# Patient Record
Sex: Female | Born: 1990 | Race: White | Hispanic: No | Marital: Married | Smoking: Never smoker
Health system: Southern US, Community
[De-identification: ages and names within clinical notes are randomized; demographics above are authoritative.]

## PROBLEM LIST (undated history)

## (undated) ENCOUNTER — Inpatient Hospital Stay: Payer: Self-pay

## (undated) DIAGNOSIS — O139 Gestational [pregnancy-induced] hypertension without significant proteinuria, unspecified trimester: Secondary | ICD-10-CM

## (undated) DIAGNOSIS — O99210 Obesity complicating pregnancy, unspecified trimester: Secondary | ICD-10-CM

---
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
## 2009-06-10 ENCOUNTER — Encounter: Payer: Self-pay | Admitting: Obstetrics and Gynecology

## 2009-08-19 ENCOUNTER — Encounter: Payer: Self-pay | Admitting: Maternal and Fetal Medicine

## 2009-08-26 ENCOUNTER — Encounter: Payer: Self-pay | Admitting: Obstetrics & Gynecology

## 2009-09-14 ENCOUNTER — Ambulatory Visit: Payer: Self-pay | Admitting: Obstetrics & Gynecology

## 2009-10-03 ENCOUNTER — Observation Stay: Payer: Self-pay

## 2009-10-05 ENCOUNTER — Inpatient Hospital Stay: Payer: Self-pay | Admitting: Obstetrics & Gynecology

## 2011-03-03 ENCOUNTER — Emergency Department: Payer: Self-pay | Admitting: Emergency Medicine

## 2011-10-21 ENCOUNTER — Emergency Department: Payer: Self-pay | Admitting: Unknown Physician Specialty

## 2012-05-15 ENCOUNTER — Emergency Department: Payer: Self-pay | Admitting: Emergency Medicine

## 2012-12-02 ENCOUNTER — Emergency Department: Payer: Self-pay | Admitting: Emergency Medicine

## 2013-02-07 ENCOUNTER — Emergency Department: Payer: Self-pay | Admitting: Emergency Medicine

## 2014-04-06 ENCOUNTER — Encounter: Payer: Self-pay | Admitting: Obstetrics and Gynecology

## 2014-05-21 ENCOUNTER — Encounter
Admit: 2014-05-21 | Disposition: A | Discharge status: Home / Self Care | Payer: Self-pay | Attending: Maternal & Fetal Medicine | Admitting: Maternal & Fetal Medicine

## 2014-07-16 ENCOUNTER — Ambulatory Visit
Admission: RE | Admit: 2014-07-16 | Discharge: 2014-07-16 | Disposition: A | Discharge status: Home / Self Care | Payer: Medicaid Other | Source: Ambulatory Visit | Attending: Maternal & Fetal Medicine | Admitting: Maternal & Fetal Medicine

## 2014-07-16 DIAGNOSIS — O99210 Obesity complicating pregnancy, unspecified trimester: Secondary | ICD-10-CM

## 2014-07-16 DIAGNOSIS — O359XX0 Maternal care for (suspected) fetal abnormality and damage, unspecified, not applicable or unspecified: Secondary | ICD-10-CM | POA: Insufficient documentation

## 2014-07-16 DIAGNOSIS — Z3A27 27 weeks gestation of pregnancy: Secondary | ICD-10-CM | POA: Insufficient documentation

## 2014-07-16 HISTORY — DX: Gestational (pregnancy-induced) hypertension without significant proteinuria, unspecified trimester: O13.9

## 2014-07-20 ENCOUNTER — Ambulatory Visit
Admission: RE | Admit: 2014-07-20 | Discharge: 2014-07-20 | Disposition: A | Discharge status: Home / Self Care | Payer: Medicaid Other | Source: Ambulatory Visit | Attending: Obstetrics and Gynecology | Admitting: Obstetrics and Gynecology

## 2014-07-20 DIAGNOSIS — Z3A28 28 weeks gestation of pregnancy: Secondary | ICD-10-CM | POA: Insufficient documentation

## 2014-07-20 DIAGNOSIS — O4103X0 Oligohydramnios, third trimester, not applicable or unspecified: Secondary | ICD-10-CM | POA: Insufficient documentation

## 2014-07-20 DIAGNOSIS — O4100X0 Oligohydramnios, unspecified trimester, not applicable or unspecified: Secondary | ICD-10-CM

## 2014-07-20 DIAGNOSIS — O365990 Maternal care for other known or suspected poor fetal growth, unspecified trimester, not applicable or unspecified: Secondary | ICD-10-CM | POA: Insufficient documentation

## 2014-07-20 DIAGNOSIS — O365930 Maternal care for other known or suspected poor fetal growth, third trimester, not applicable or unspecified: Secondary | ICD-10-CM | POA: Insufficient documentation

## 2014-07-20 DIAGNOSIS — IMO0002 Reserved for concepts with insufficient information to code with codable children: Secondary | ICD-10-CM

## 2014-07-20 NOTE — Progress Notes (Signed)
F/u on Thursday for dopplers (not Monday) .

## 2014-07-20 NOTE — Progress Notes (Addendum)
See ultrasound report - BPP 8/8  AFI improved at 8.6 F/u Monday for dopplers

## 2014-07-23 ENCOUNTER — Observation Stay
Admission: RE | Admit: 2014-07-23 | Discharge: 2014-07-24 | Disposition: A | Discharge status: Home / Self Care | Payer: Medicaid Other | Source: Ambulatory Visit | Attending: Obstetrics and Gynecology | Admitting: Obstetrics and Gynecology

## 2014-07-23 ENCOUNTER — Encounter: Payer: Self-pay | Admitting: *Deleted

## 2014-07-23 ENCOUNTER — Ambulatory Visit
Admission: RE | Admit: 2014-07-23 | Discharge: 2014-07-23 | Disposition: A | Discharge status: Home / Self Care | Payer: Medicaid Other | Source: Ambulatory Visit | Attending: Maternal & Fetal Medicine | Admitting: Maternal & Fetal Medicine

## 2014-07-23 VITALS — BP 153/82 | HR 75 | Temp 98.2°F | Resp 18 | Ht 64.8 in | Wt 265.0 lb

## 2014-07-23 DIAGNOSIS — Z3A28 28 weeks gestation of pregnancy: Secondary | ICD-10-CM | POA: Insufficient documentation

## 2014-07-23 DIAGNOSIS — O365990 Maternal care for other known or suspected poor fetal growth, unspecified trimester, not applicable or unspecified: Secondary | ICD-10-CM | POA: Diagnosis present

## 2014-07-23 DIAGNOSIS — O163 Unspecified maternal hypertension, third trimester: Secondary | ICD-10-CM | POA: Diagnosis present

## 2014-07-23 DIAGNOSIS — O365930 Maternal care for other known or suspected poor fetal growth, third trimester, not applicable or unspecified: Secondary | ICD-10-CM

## 2014-07-23 DIAGNOSIS — O4103X0 Oligohydramnios, third trimester, not applicable or unspecified: Secondary | ICD-10-CM | POA: Insufficient documentation

## 2014-07-23 DIAGNOSIS — R030 Elevated blood-pressure reading, without diagnosis of hypertension: Secondary | ICD-10-CM | POA: Diagnosis present

## 2014-07-23 DIAGNOSIS — O365932 Maternal care for other known or suspected poor fetal growth, third trimester, fetus 2: Secondary | ICD-10-CM

## 2014-07-23 DIAGNOSIS — O26893 Other specified pregnancy related conditions, third trimester: Secondary | ICD-10-CM | POA: Insufficient documentation

## 2014-07-23 DIAGNOSIS — Z6741 Type O blood, Rh negative: Secondary | ICD-10-CM | POA: Insufficient documentation

## 2014-07-23 DIAGNOSIS — O133 Gestational [pregnancy-induced] hypertension without significant proteinuria, third trimester: Principal | ICD-10-CM | POA: Insufficient documentation

## 2014-07-23 LAB — PROTEIN / CREATININE RATIO, URINE
CREATININE, URINE: 245 mg/dL
Protein Creatinine Ratio: 0.05 mg/mg{Cre} (ref 0.00–0.15)
TOTAL PROTEIN, URINE: 13 mg/dL

## 2014-07-23 LAB — CBC
HCT: 34.6 % — ABNORMAL LOW (ref 35.0–47.0)
Hemoglobin: 11.4 g/dL — ABNORMAL LOW (ref 12.0–16.0)
MCH: 30.4 pg (ref 26.0–34.0)
MCHC: 33 g/dL (ref 32.0–36.0)
MCV: 91.9 fL (ref 80.0–100.0)
PLATELETS: 114 10*3/uL — AB (ref 150–440)
RBC: 3.76 MIL/uL — ABNORMAL LOW (ref 3.80–5.20)
RDW: 12.8 % (ref 11.5–14.5)
WBC: 10.6 10*3/uL (ref 3.6–11.0)

## 2014-07-23 LAB — COMPREHENSIVE METABOLIC PANEL
ALBUMIN: 2.6 g/dL — AB (ref 3.5–5.0)
ALK PHOS: 92 U/L (ref 38–126)
ALT: 13 U/L — ABNORMAL LOW (ref 14–54)
AST: 19 U/L (ref 15–41)
Anion gap: 7 (ref 5–15)
BILIRUBIN TOTAL: 0.5 mg/dL (ref 0.3–1.2)
BUN: 6 mg/dL (ref 6–20)
CO2: 21 mmol/L — ABNORMAL LOW (ref 22–32)
CREATININE: 0.58 mg/dL (ref 0.44–1.00)
Calcium: 9 mg/dL (ref 8.9–10.3)
Chloride: 111 mmol/L (ref 101–111)
GFR calc Af Amer: >60 mL/min (ref >60)
GFR calc non Af Amer: >60 mL/min (ref >60)
Glucose, Bld: 74 mg/dL (ref 65–99)
Potassium: 3.8 mmol/L (ref 3.5–5.1)
SODIUM: 139 mmol/L (ref 135–145)
Total Protein: 6.2 g/dL — ABNORMAL LOW (ref 6.5–8.1)

## 2014-07-23 MED ORDER — ACETAMINOPHEN 325 MG PO TABS: 650.0000 mg | ORAL_TABLET | ORAL | Status: DC | PRN

## 2014-07-23 MED ORDER — CALCIUM CARBONATE ANTACID 500 MG PO CHEW: 2.0000 | CHEWABLE_TABLET | ORAL | Status: DC | PRN

## 2014-07-23 MED ORDER — BETAMETHASONE SOD PHOS & ACET 6 (3-3) MG/ML IJ SUSP
12.0000 mg | INTRAMUSCULAR | Status: AC
  Administered 2014-07-23 – 2014-07-24 (×2): 12 mg via INTRAMUSCULAR
  Filled 2014-07-23: qty 2

## 2014-07-23 MED ORDER — NIFEDIPINE 10 MG PO CAPS
10.0000 mg | ORAL_CAPSULE | ORAL | Status: DC | PRN
  Filled 2014-07-23: qty 2

## 2014-07-23 MED ORDER — DOCUSATE SODIUM 100 MG PO CAPS
100.0000 mg | ORAL_CAPSULE | Freq: Every day | ORAL | Status: DC
  Administered 2014-07-24: 100 mg via ORAL
  Filled 2014-07-23: qty 1

## 2014-07-23 MED ORDER — BETAMETHASONE SOD PHOS & ACET 6 (3-3) MG/ML IJ SUSP
INTRAMUSCULAR | Status: AC
  Filled 2014-07-23: qty 1

## 2014-07-23 MED ORDER — PRENATAL MULTIVITAMIN CH
1.0000 | ORAL_TABLET | Freq: Every day | ORAL | Status: DC
Start: 2014-07-24 — End: 2014-07-25
  Administered 2014-07-24: 1 via ORAL
  Filled 2014-07-23 (×3): qty 1

## 2014-07-23 MED ORDER — RHO D IMMUNE GLOBULIN 1500 UNIT/2ML IJ SOSY
300.0000 ug | PREFILLED_SYRINGE | Freq: Once | INTRAMUSCULAR | Status: DC
  Filled 2014-07-23: qty 2

## 2014-07-23 NOTE — Plan of Care (Signed)
Problem: Consults Goal: Birthing Suites Patient Information Press F2 to bring up selections list Outcome: Not Applicable Date Met:  81/84/03  PIH (Pregnancy induced hypertension)

## 2014-07-23 NOTE — Progress Notes (Signed)
Patient seen at [facility] perinatal unit (printer not functioning) and referred to labor and delivery for evaluation due to bp elevation.  Ultrasound report text:  "Single intrauterine pregnancy with an estimated gestational age of [redacted] weeks 5 day(s).  Dating assigned by LMP concordant with ultrasound performed at [facility] on 04/06/2014; measurements at that exam reported as  13 weeks 2 days. Her history is significant for previous pregnancy complicated by severe, preeterm preeclampsia (34 week delivery).   The patient returned to [facility] on 07/16/2014, Then the EFW was 760g, which was < the 3rd percentile.  there was mild oligohydramnios wiht an AFI of 4.7 with normal dopplers.  Today BPP is 8/8 and the AFI is 6.6.  A 2x2 cm pocket off fluid is visualized.    Umbilical artery dopplers are at the upper limit of normal, with an S/D ratio of 3.6 (upper limit of normal for gestational age is 3.7)  Her blood pressure is 150/83 (repeat).  She denies clinical symptoms associated with severe preeclampsia. She will be referred to labor and delivery for further evalaution/preeclampsia labs and blood pressure evaluation."  M. Merdis Snodgrass  Continue twice weekly testing. She has follow up visits scheduled at [facility] next week for bpp and dopplers, she also has NST scheduled for labor and delivery Monday July 4th.

## 2014-07-23 NOTE — H&P (Signed)
OB Admission History & Physical   History of Present Illness:  Chief Complaint: Sent over from [facility] for elevated blood pressures  HPI:  Vanessa Chen is a 24 y.o. [phone] female at [redacted]w[redacted]d dated by LMP c/w 13 week u/s.  Her pregnancy has been complicated by a history of severe preeclampsia with G1 necessitating delivery at 34 week by cesarean (breech), this followed by two additional cesarean deliveries.  This pregnancy is further complicated by fetal growth restriction (<3rd %ile), oligohydramnios, though seems to be resolved at this point.  She was being seen at [facility] clinic today at [facility] for BPP, AFI, and UA dopplers (all normal), when she was note to have elevated BPs (140's-150s/80s).  She denies headaches, visual changes, and RUQ pain.  She does have some upper and lower extremity edema.   She denies contractions.   She denies leakage of fluid.   She denies vaginal bleeding.   She notes good fetal movement.    Maternal Medical History:   Past Medical History  Diagnosis Date  . Pregnancy induced hypertension     Past Surgical History  Procedure Laterality Date  . Cesarean section     Allergies: No Known Allergies  Prior to Admission medications   Medication Sig Start Date End Date Taking? Authorizing Provider  Prenatal Vit-Fe Fumarate-FA (PRENATAL MULTIVITAMIN) TABS tablet Take 1 tablet by mouth daily at 12 noon.    Historical Provider, MD    OB History  Gravida Para Term Preterm AB SAB TAB Ectopic Multiple Living  # Outcome Date GA Lbr Len/2nd Weight Sex Delivery Anes PTL Lv  4 Current           3 Term 10/05/09     CS-Unspec     2 Term 02/06/08     CS-Unspec        Complications: Preeclampsia  1 Preterm 12/30/04    M CS-Unspec        Complications: Preeclampsia      Prenatal care site: [facility], [facility] at [facility]  Social History: She  reports that she has never smoked. She has never used smokeless tobacco. She reports that  she does not drink alcohol or use illicit drugs.  Family History: family history is not on file.   Review of Systems: Negative x 10 systems reviewed except as noted in the HPI.    Physical Exam:  Vital Signs: BP 142/83 mmHg  Pulse 70  LMP 01/03/2014 General: no acute distress.  HEENT: normocephalic, atraumatic Heart: regular rate & rhythm.  No murmurs/rubs/gallops Lungs: clear to auscultation bilaterally Abdomen: soft, gravid, non-tender;  EFW: 760 g on 07/16/14 Pelvic: deferred Extremities: non-tender, symmetric, no edema bilaterally.  DTRs: 2+  Neurologic: Alert & oriented x 3.    Pertinent Results:  Prenatal Labs: Blood type/Rh O negative  Antibody screen negative  Rubella Immune  RPR Non reactive  HBsAg negative  HIV neg  GC neg  Chlamydia neg  Genetic screening Neg   1 hour GTT 132 (early - 03/28/14)  3 hour GTT n/a  GBS unknown    Baseline FHR: 145 beats per min    Variability: moderate    Accelerations: 10x10 present    Decelerations: negative Contractions: absent Overall assessment: reassuring given gestational age    Labs:  Component     Latest Ref Rng 07/23/2014  Glucose     65 - 99 mg/dL 74  Creatinine     0.44 - 1.00 mg/dL 0.13  AST     15 - 41 U/L 19  ALT     14 - 54 U/L 13 (L)  Alkaline Phosphatase     38 - 126 U/L 92  Total Bilirubin     0.3 - 1.2 mg/dL 0.5  WBC     3.6 - 14.3 K/uL 10.6  RBC     3.80 - 5.20 MIL/uL 3.76 (L)  Hemoglobin     12.0 - 16.0 g/dL 88.8 (L)  HCT     75.7 - 47.0 % 34.6 (L)  Platelets     150 - 440 K/uL 114 (L)  Creatinine, Urine      245  Total Protein, Urine      13  Protein Creatinine Ratio     0.00 - 0.15 mg/mgCre 0.05   Ultrasound: See note From Dr. Dolphus Jenny from [facility]  Assessment:  Vanessa Chen is a 24 y.o. [phone] female at [redacted]w[redacted]d with elevated BPs in the setting of growth restriction (<3rd %ile) and oligohydramnios).  This patient presents a complicated management scenario. I have spoken with  Dr. Consuelo Pandy who has already evaluated the patient today, and updated her with her vital signs and lab results.  Based on discussion with her, I will admit the patient for further observation with plan to transfer patient for inpatient management to [facility] if she shows any indication of severe features.  To that end, will admit for observation.  See plan below.   Plan:  1. Admit for observation 2. Serial BPs 3. Administer BMTZ 4. Get type and screen, then give rhogam 5. 24 hour urine total protein 6. Will initially monitor on L&D and as frequency of BP measurements decreases, may move to floor. 7. Will transfer to [facility] for any signs of severe preeclampsia features 8. Possible discharge tomorrow if BPs stable.  Patient does have close follow up.     Conard Novak, MD, FACOG 07/23/2014 4:04 PM

## 2014-07-23 NOTE — OB Triage Note (Signed)
Pt received to LD from [facility] perinatal clinic for increased BP in clinic.

## 2014-07-24 LAB — COMPREHENSIVE METABOLIC PANEL
ALBUMIN: 2.6 g/dL — AB (ref 3.5–5.0)
ALK PHOS: 88 U/L (ref 38–126)
ALT: 15 U/L (ref 14–54)
AST: 25 U/L (ref 15–41)
Anion gap: 9 (ref 5–15)
BUN: 7 mg/dL (ref 6–20)
CALCIUM: 9 mg/dL (ref 8.9–10.3)
CO2: 19 mmol/L — AB (ref 22–32)
Chloride: 108 mmol/L (ref 101–111)
Creatinine, Ser: 0.48 mg/dL (ref 0.44–1.00)
Glucose, Bld: 119 mg/dL — ABNORMAL HIGH (ref 65–99)
Potassium: 4.1 mmol/L (ref 3.5–5.1)
SODIUM: 136 mmol/L (ref 135–145)
TOTAL PROTEIN: 6.1 g/dL — AB (ref 6.5–8.1)
Total Bilirubin: 0.7 mg/dL (ref 0.3–1.2)

## 2014-07-24 LAB — ABO/RH: ABO/RH(D): O NEG

## 2014-07-24 LAB — CBC
HCT: 36.4 % (ref 35.0–47.0)
HEMOGLOBIN: 12.3 g/dL (ref 12.0–16.0)
MCH: 30.9 pg (ref 26.0–34.0)
MCHC: 33.7 g/dL (ref 32.0–36.0)
MCV: 91.7 fL (ref 80.0–100.0)
PLATELETS: 121 10*3/uL — AB (ref 150–440)
RBC: 3.97 MIL/uL (ref 3.80–5.20)
RDW: 12.9 % (ref 11.5–14.5)
WBC: 13.6 10*3/uL — ABNORMAL HIGH (ref 3.6–11.0)

## 2014-07-24 LAB — HEMOGLOBIN A1C
Hgb A1c MFr Bld: 5.3 % (ref 4.0–6.0)
Hgb A1c MFr Bld: 5.2 % (ref 4.0–6.0)

## 2014-07-24 LAB — TYPE AND SCREEN
ABO/RH(D): O NEG
Antibody Screen: NEGATIVE

## 2014-07-24 NOTE — Discharge Instructions (Signed)
You have been diagnosed with gestational hypertension, which may evolve and become pre-eclampsia.  Please keep your prenatal appointments, as your pregnancy is high-risk.    Look for signs of worsening disease and report them to [place] immediately: 1. A headache that does not go away with Tylenol 2. Visual changes that persist 3. Right upper quadrant pain, or upper stomach pains 4. Shortness of breath at rest 5. Blood pressures that are greater than 160 (top number) or 110 (bottom number)  Continue to monitor movement of the baby, the presence of contractions, leakage of fluid, and vaginal bleeding.

## 2014-07-24 NOTE — Discharge Summary (Signed)
Antenatal Physician Discharge Summary  Patient ID: Vanessa Chen MRN: [id number] DOB/AGE: 1991/01/26 24 y.o.  Admit date: 07/23/2014 Discharge date: 07/24/2014  Admission Diagnoses: high-risk pregnancy in 3rd trimester. high blood pressures, oligohydramnios, fetal growth restriction, Rh negative maternal state.  Discharge Diagnoses: same  Prenatal Procedures: Rhogam, Betamethazone (x2), biophyisical profile, ultrasound, NST.  Consults: Neonatology, Maternal Fetal Medicine  Significant Diagnostic Studies:  Results for orders placed or performed during the hospital encounter of 07/23/14 (from the past 168 hour(s))  Protein / creatinine ratio, urine   Collection Time: 07/23/14 12:13 PM  Result Value Ref Range   Creatinine, Urine 245 mg/dL   Total Protein, Urine 13 mg/dL   Protein Creatinine Ratio 0.05 0.00 - 0.15 mg/mg[Cre]  Comprehensive metabolic panel   Collection Time: 07/23/14 12:36 PM  Result Value Ref Range   Sodium 139 135 - 145 mmol/L   Potassium 3.8 3.5 - 5.1 mmol/L   Chloride 111 101 - 111 mmol/L   CO2 21 (L) 22 - 32 mmol/L   Glucose, Bld 74 65 - 99 mg/dL   BUN 6 6 - 20 mg/dL   Creatinine, Ser 5.10 0.44 - 1.00 mg/dL   Calcium 9.0 8.9 - 25.8 mg/dL   Total Protein 6.2 (L) 6.5 - 8.1 g/dL   Albumin 2.6 (L) 3.5 - 5.0 g/dL   AST 19 15 - 41 U/L   ALT 13 (L) 14 - 54 U/L   Alkaline Phosphatase 92 38 - 126 U/L   Total Bilirubin 0.5 0.3 - 1.2 mg/dL   GFR calc non Af Amer >60 >60 mL/min   GFR calc Af Amer >60 >60 mL/min   Anion gap 7 5 - 15  CBC   Collection Time: 07/23/14 12:36 PM  Result Value Ref Range   WBC 10.6 3.6 - 11.0 K/uL   RBC 3.76 (L) 3.80 - 5.20 MIL/uL   Hemoglobin 11.4 (L) 12.0 - 16.0 g/dL   HCT 52.7 (L) 78.2 - 42.3 %   MCV 91.9 80.0 - 100.0 fL   MCH 30.4 26.0 - 34.0 pg   MCHC 33.0 32.0 - 36.0 g/dL   RDW 53.6 14.4 - 31.5 %   Platelets 114 (L) 150 - 440 K/uL  Hemoglobin A1c   Collection Time: 07/23/14  4:12 PM  Result Value Ref Range   Hgb A1c MFr  Bld 5.3 4.0 - 6.0 %  CBC   Collection Time: 07/24/14  4:43 AM  Result Value Ref Range   WBC 13.6 (H) 3.6 - 11.0 K/uL   RBC 3.97 3.80 - 5.20 MIL/uL   Hemoglobin 12.3 12.0 - 16.0 g/dL   HCT 40.0 86.7 - 61.9 %   MCV 91.7 80.0 - 100.0 fL   MCH 30.9 26.0 - 34.0 pg   MCHC 33.7 32.0 - 36.0 g/dL   RDW 50.9 32.6 - 71.2 %   Platelets 121 (L) 150 - 440 K/uL  Comprehensive metabolic panel   Collection Time: 07/24/14  4:43 AM  Result Value Ref Range   Sodium 136 135 - 145 mmol/L   Potassium 4.1 3.5 - 5.1 mmol/L   Chloride 108 101 - 111 mmol/L   CO2 19 (L) 22 - 32 mmol/L   Glucose, Bld 119 (H) 65 - 99 mg/dL   BUN 7 6 - 20 mg/dL   Creatinine, Ser 4.58 0.44 - 1.00 mg/dL   Calcium 9.0 8.9 - 09.9 mg/dL   Total Protein 6.1 (L) 6.5 - 8.1 g/dL   Albumin 2.6 (L) 3.5 - 5.0 g/dL  AST 25 15 - 41 U/L   ALT 15 14 - 54 U/L   Alkaline Phosphatase 88 38 - 126 U/L   Total Bilirubin 0.7 0.3 - 1.2 mg/dL   GFR calc non Af Amer >60 >60 mL/min   GFR calc Af Amer >60 >60 mL/min   Anion gap 9 5 - 15  Hemoglobin A1c   Collection Time: 07/24/14  4:44 AM  Result Value Ref Range   Hgb A1c MFr Bld 5.2 4.0 - 6.0 %  Type and screen   Collection Time: 07/24/14  4:44 AM  Result Value Ref Range   ABO/RH(D) O NEG    Antibody Screen NEG    Sample Expiration 07/27/2014   ABO/Rh   Collection Time: 07/24/14  4:44 AM  Result Value Ref Range   ABO/RH(D) O NEG    NST:  Baseline 150, moderate variability, 10x10 accelerations noted, however not criteria for reactivity.  Hospital Course:  This is a 24 y.o. [phone] with IUP at [redacted]w[redacted]d admitted for elevated blood pressures during third trimester of pregnancy. No leaking of fluid and no bleeding.  She received betamethasone x 2 doses. Blood pressures normalized after admission. She was seen by Neonatology during her stay.  She was observed, fetal heart rate monitoring remained reassuring, and she had no signs/symptoms of progressing preeclampsia, preterm labor, or other  maternal-fetal concerns.  She was deemed stable for discharge to home with outpatient follow up.  Discharge Exam: BP 114/71 mmHg  Pulse 71  Temp(Src) 98 F (36.7 C) (Oral)  Resp 18  SpO2 98%  LMP 01/03/2014 General appearance: alert and no distress Resp: clear to auscultation bilaterally and normal repsiratory effort Cardio: regular rate and rhythm Extremities: extremities normal, atraumatic, no cyanosis or edema   Discharge Condition: Stable  Disposition: discharge to home, with close follow up at [facility] and [facility].     Medication List    ASK your doctor about these medications        prenatal multivitamin Tabs tablet  Take 1 tablet by mouth daily at 12 noon.           Follow-up Information    Follow up with Conard Novak, MD In 3 days.   Specialty:  Obstetrics and Gynecology   Why:  for follow up and blood pressure monitoring   Contact information:   [address] [place] [address] [phone]        Has appointment with [facility] on Monday, June 27.    Signed: Leola Brazil M.D. 07/24/2014, 5:30 PM

## 2014-07-25 LAB — MISC LABCORP TEST (SEND OUT): Labcorp test code: 13664

## 2014-07-27 ENCOUNTER — Ambulatory Visit (HOSPITAL_COMMUNITY)
Admission: AD | Admit: 2014-07-27 | Discharge: 2014-07-27 | Disposition: A | Discharge status: Home / Self Care | Payer: Medicaid Other | Source: Other Acute Inpatient Hospital | Attending: Obstetrics & Gynecology | Admitting: Obstetrics & Gynecology

## 2014-07-27 ENCOUNTER — Observation Stay
Admission: EM | Admit: 2014-07-27 | Discharge: 2014-07-27 | Payer: Medicaid Other | Attending: Certified Nurse Midwife | Admitting: Certified Nurse Midwife

## 2014-07-27 ENCOUNTER — Encounter: Payer: Self-pay | Admitting: *Deleted

## 2014-07-27 ENCOUNTER — Ambulatory Visit
Admission: RE | Admit: 2014-07-27 | Discharge: 2014-07-27 | Disposition: A | Discharge status: Home / Self Care | Payer: Medicaid Other | Source: Ambulatory Visit | Attending: Maternal & Fetal Medicine | Admitting: Maternal & Fetal Medicine

## 2014-07-27 VITALS — BP 163/112 | HR 60 | Temp 98.2°F | Resp 18 | Ht 64.0 in | Wt 270.0 lb

## 2014-07-27 DIAGNOSIS — Z3A29 29 weeks gestation of pregnancy: Secondary | ICD-10-CM | POA: Insufficient documentation

## 2014-07-27 DIAGNOSIS — O169 Unspecified maternal hypertension, unspecified trimester: Secondary | ICD-10-CM | POA: Insufficient documentation

## 2014-07-27 DIAGNOSIS — Z3A00 Weeks of gestation of pregnancy not specified: Secondary | ICD-10-CM | POA: Diagnosis not present

## 2014-07-27 DIAGNOSIS — O1493 Unspecified pre-eclampsia, third trimester: Principal | ICD-10-CM | POA: Insufficient documentation

## 2014-07-27 DIAGNOSIS — O365930 Maternal care for other known or suspected poor fetal growth, third trimester, not applicable or unspecified: Secondary | ICD-10-CM

## 2014-07-27 DIAGNOSIS — R030 Elevated blood-pressure reading, without diagnosis of hypertension: Secondary | ICD-10-CM

## 2014-07-27 DIAGNOSIS — O1490 Unspecified pre-eclampsia, unspecified trimester: Secondary | ICD-10-CM | POA: Insufficient documentation

## 2014-07-27 DIAGNOSIS — O163 Unspecified maternal hypertension, third trimester: Secondary | ICD-10-CM | POA: Diagnosis present

## 2014-07-27 DIAGNOSIS — O4103X0 Oligohydramnios, third trimester, not applicable or unspecified: Secondary | ICD-10-CM | POA: Insufficient documentation

## 2014-07-27 DIAGNOSIS — Z36 Encounter for antenatal screening of mother: Secondary | ICD-10-CM | POA: Insufficient documentation

## 2014-07-27 DIAGNOSIS — IMO0002 Reserved for concepts with insufficient information to code with codable children: Secondary | ICD-10-CM

## 2014-07-27 HISTORY — DX: Obesity complicating pregnancy, unspecified trimester: O99.210

## 2014-07-27 LAB — MISC LABCORP TEST (SEND OUT): LABCORP TEST CODE: 13664

## 2014-07-27 LAB — PROTEIN, URINE, 24 HOUR
Collection Interval-UPROT: 24 hours
PROTEIN, URINE: 11 mg/dL
Protein, 24H Urine: 124 mg/d — ABNORMAL HIGH (ref 50–100)
Urine Total Volume-UPROT: 1125 mL
Urine Total Volume-UPROT: 1125 mL

## 2014-07-27 LAB — CBC
HEMATOCRIT: 34.4 % — AB (ref 35.0–47.0)
Hemoglobin: 11.4 g/dL — ABNORMAL LOW (ref 12.0–16.0)
MCH: 30.3 pg (ref 26.0–34.0)
MCHC: 33.3 g/dL (ref 32.0–36.0)
MCV: 91 fL (ref 80.0–100.0)
Platelets: 106 10*3/uL — ABNORMAL LOW (ref 150–440)
RBC: 3.78 MIL/uL — ABNORMAL LOW (ref 3.80–5.20)
RDW: 13 % (ref 11.5–14.5)
WBC: 15.4 10*3/uL — AB (ref 3.6–11.0)

## 2014-07-27 LAB — COMPREHENSIVE METABOLIC PANEL
ALT: 40 U/L (ref 14–54)
ANION GAP: 8 (ref 5–15)
AST: 45 U/L — ABNORMAL HIGH (ref 15–41)
Albumin: 2.6 g/dL — ABNORMAL LOW (ref 3.5–5.0)
Alkaline Phosphatase: 75 U/L (ref 38–126)
BUN: 11 mg/dL (ref 6–20)
CALCIUM: 8.4 mg/dL — AB (ref 8.9–10.3)
CO2: 21 mmol/L — ABNORMAL LOW (ref 22–32)
Chloride: 109 mmol/L (ref 101–111)
Creatinine, Ser: 0.51 mg/dL (ref 0.44–1.00)
GFR calc Af Amer: >60 mL/min (ref >60)
GFR calc non Af Amer: >60 mL/min (ref >60)
Glucose, Bld: 74 mg/dL (ref 65–99)
Potassium: 3.6 mmol/L (ref 3.5–5.1)
SODIUM: 138 mmol/L (ref 135–145)
TOTAL PROTEIN: 5.8 g/dL — AB (ref 6.5–8.1)
Total Bilirubin: 0.6 mg/dL (ref 0.3–1.2)

## 2014-07-27 LAB — PROTEIN / CREATININE RATIO, URINE
Creatinine, Urine: 212 mg/dL
Protein Creatinine Ratio: 0.87 mg/mg{Cre} — ABNORMAL HIGH (ref 0.00–0.15)
Total Protein, Urine: 185 mg/dL

## 2014-07-27 MED ORDER — LACTATED RINGERS IV SOLN
INTRAVENOUS | Status: DC
  Administered 2014-07-27: 16:00:00 via INTRAVENOUS

## 2014-07-27 MED ORDER — NIFEDIPINE ER 30 MG PO TB24
30.0000 mg | ORAL_TABLET | Freq: Every day | ORAL | Status: DC
  Administered 2014-07-27: 30 mg via ORAL
  Filled 2014-07-27: qty 1

## 2014-07-27 MED ORDER — LACTATED RINGERS IV SOLN: INTRAVENOUS | Status: DC

## 2014-07-27 NOTE — H&P (Signed)
History of Present Illness:  Chief Complaint: Sent over from [facility] for elevated blood pressures  HPI:  Vanessa Chen is a 24 y.o. [phone] female with EDC=10/10/2014 at 29wk2 dated by LMP c/w 13 week u/s. Her pregnancy has been complicated by a history of severe preeclampsia with G1 necessitating delivery at 34 week by cesarean (breech), this followed by two additional cesarean deliveries. This pregnancy is further complicated by fetal growth restriction (<3rd %ile), oligohydramnios, though seems to be resolved at this point. She was being seen at [facility] clinic today at [facility] for BPP, AFI, and UA dopplers (all normal), when she was note to have elevated BP of 169/112. She denies headaches, visual changes, and RUQ pain. She does have some upper and lower extremity edema. Blood pressures were 170/92, 176/81, 176/97 on arrival to L&D. She was given Nifedipine 30 mgm XL which brought the blood pressures down to normal range.  She was also seen here on 24 June and evaluated for elevated blood pressures at [facility]. Her labs were normal at that time except for platelets of 121K and a 24 hour urine was 115mm protein. She was not started on any antihypertensives at that time since her blood pressures were better on bedrest. She did receive BMZ x 2 doses. She did not get her 28 week labs and Rhogam  She denies contractions. She denies leakage of fluid. She denies vaginal bleeding. She notes good fetal movement.   Maternal Medical History:   Past Medical History  Diagnosis Date  . Pregnancy induced hypertension   Obesity with BMI>40 Past Surgical History  Procedure Laterality Date  . Cesarean sectionx3     Allergies: No Known Allergies  Prior to Admission medications   Medication Sig Start Date End Date Taking? Authorizing Provider  Prenatal Vit-Fe Fumarate-FA (PRENATAL MULTIVITAMIN) TABS tablet Take 1 tablet by mouth daily at 12 noon.     Historical Provider, MD    OB History  Gravida Para Term Preterm AB SAB TAB Ectopic Multiple Living  4 3 2 1      3     # Outcome Date GA Lbr Len/2nd Weight Sex Delivery Anes PTL Lv  4 Current           3 Term 10/05/09     LTCS     2 Term 02/06/08     LTCS      Complications: Preeclampsia  1 Preterm 12/30/04    M LTCS      Complications: Preeclampsia/HELLP/breech      [facility] Social History: She  reports that she has never smoked. She has never used smokeless tobacco. She reports that she does not drink alcohol or use illicit drugs.  Family History: family history is not on file.   Review of Systems: Negative x 10 systems reviewed except as noted in the HPI.   Physical Exam:  Vital Signs: Temp:  [98.2 F (36.8 C)] 98.2 F (36.8 C) (06/27 0813) Pulse Rate:  [54-87] 87 (06/27 1350) Resp:  [18] 18 (06/27 0813) BP: (106-176)/(66-112) 106/66 mmHg (06/27 1350) SpO2:  [96 %] 96 % (06/27 0813) Weight:  [122.471 kg (270 lb)] 122.471 kg (270 lb) (06/27 0813) General: no acute distress.  HEENT: normocephalic, atraumatic Heart: regular rate & rhythm. No murmurs Lungs: clear to auscultation bilaterally Abdomen: soft, gravid, non-tender; EFW: 760 g on 64/43/15 cephalic on today's ultrasound Pelvic: deferred Extremities: non-tender, symmetric, +1 edema bilaterally. DTRs: 3+  Neurologic: Alert & oriented x 3.   Pertinent Results:  Prenatal Labs: Blood type/Rh O negative  Antibody screen negative  Rubella Immune  RPR Non reactive  HBsAg negative  HIV neg  GC neg  Chlamydia neg  Genetic screening Neg   1 hour GTT 132 (early - 03/28/14)  3 hour GTT n/a  GBS unknown    Baseline FHR: 145 beats per min Variability: moderate Accelerations: 10x10 present  Decelerations: negative Contractions: absent Overall  assessment: reassuring given gestational age    Labs:  Recent Results (from the past 2160 hour(s))  Protein / creatinine ratio, urine     Status: None   Collection Time: 07/23/14 12:13 PM  Result Value Ref Range   Creatinine, Urine 245 mg/dL   Total Protein, Urine 13 mg/dL    Comment: NO NORMAL RANGE ESTABLISHED FOR THIS TEST   Protein Creatinine Ratio 0.05 0.00 - 0.15 mg/mg[Cre]  Comprehensive metabolic panel     Status: Abnormal   Collection Time: 07/23/14 12:36 PM  Result Value Ref Range   Sodium 139 135 - 145 mmol/L   Potassium 3.8 3.5 - 5.1 mmol/L   Chloride 111 101 - 111 mmol/L   CO2 21 (L) 22 - 32 mmol/L   Glucose, Bld 74 65 - 99 mg/dL   BUN 6 6 - 20 mg/dL   Creatinine, Ser 0.58 0.44 - 1.00 mg/dL   Calcium 9.0 8.9 - 10.3 mg/dL   Total Protein 6.2 (L) 6.5 - 8.1 g/dL   Albumin 2.6 (L) 3.5 - 5.0 g/dL   AST 19 15 - 41 U/L   ALT 13 (L) 14 - 54 U/L   Alkaline Phosphatase 92 38 - 126 U/L   Total Bilirubin 0.5 0.3 - 1.2 mg/dL   GFR calc non Af Amer >60 >60 mL/min   GFR calc Af Amer >60 >60 mL/min    Comment: (NOTE) The eGFR has been calculated using the CKD EPI equation. This calculation has not been validated in all clinical situations. eGFR's persistently <60 mL/min signify possible Chronic Kidney Disease.    Anion gap 7 5 - 15  CBC     Status: Abnormal   Collection Time: 07/23/14 12:36 PM  Result Value Ref Range   WBC 10.6 3.6 - 11.0 K/uL   RBC 3.76 (L) 3.80 - 5.20 MIL/uL   Hemoglobin 11.4 (L) 12.0 - 16.0 g/dL   HCT 34.6 (L) 35.0 - 47.0 %   MCV 91.9 80.0 - 100.0 fL   MCH 30.4 26.0 - 34.0 pg   MCHC 33.0 32.0 - 36.0 g/dL   RDW 12.8 11.5 - 14.5 %   Platelets 114 (L) 150 - 440 K/uL  Hemoglobin A1c     Status: None   Collection Time: 07/23/14  4:12 PM  Result Value Ref Range   Hgb A1c MFr Bld 5.3 4.0 - 6.0 %  CBC     Status: Abnormal   Collection Time: 07/24/14  4:43 AM  Result Value Ref Range   WBC 13.6 (H) 3.6 - 11.0 K/uL   RBC 3.97 3.80 - 5.20 MIL/uL    Hemoglobin 12.3 12.0 - 16.0 g/dL   HCT 36.4 35.0 - 47.0 %   MCV 91.7 80.0 - 100.0 fL   MCH 30.9 26.0 - 34.0 pg   MCHC 33.7 32.0 - 36.0 g/dL   RDW 12.9 11.5 - 14.5 %   Platelets 121 (L) 150 - 440 K/uL  Comprehensive metabolic panel     Status: Abnormal   Collection  Time: 07/24/14  4:43 AM  Result Value Ref Range   Sodium 136 135 - 145 mmol/L   Potassium 4.1 3.5 - 5.1 mmol/L   Chloride 108 101 - 111 mmol/L   CO2 19 (L) 22 - 32 mmol/L   Glucose, Bld 119 (H) 65 - 99 mg/dL   BUN 7 6 - 20 mg/dL   Creatinine, Ser 0.48 0.44 - 1.00 mg/dL   Calcium 9.0 8.9 - 10.3 mg/dL   Total Protein 6.1 (L) 6.5 - 8.1 g/dL   Albumin 2.6 (L) 3.5 - 5.0 g/dL   AST 25 15 - 41 U/L   ALT 15 14 - 54 U/L   Alkaline Phosphatase 88 38 - 126 U/L   Total Bilirubin 0.7 0.3 - 1.2 mg/dL   GFR calc non Af Amer >60 >60 mL/min   GFR calc Af Amer >60 >60 mL/min    Comment: (NOTE) The eGFR has been calculated using the CKD EPI equation. This calculation has not been validated in all clinical situations. eGFR's persistently <60 mL/min signify possible Chronic Kidney Disease.    Anion gap 9 5 - 15  Type and screen     Status: None   Collection Time: 07/24/14  4:44 AM  Result Value Ref Range   ABO/RH(D) O NEG    Antibody Screen NEG    Sample Expiration 07/27/2014   ABO/Rh     Status: None   Collection Time: 07/24/14  4:44 AM  Result Value Ref Range   ABO/RH(D) O NEG   Hemoglobin A1c     Status: None   Collection Time: 07/24/14  4:44 AM  Result Value Ref Range   Hgb A1c MFr Bld 5.2 4.0 - 6.0 %  Miscellaneous [facility] test (send-out)     Status: None   Collection Time: 07/24/14  4:35 PM  Result Value Ref Range   [facility] test code [id number]    [facility] test name PROTEIN TOTAL URINE    Misc [facility] result COMMENT     Comment: (NOTE) Test Ordered: [id number] Protein,Total,Urine Protein,Total,Urine            11.2             mg/dL    BN     Reference Range: Not Estab.                            Performed At: [facility] [place], [place] [id number] Lindon Romp MD [place]:[id number]   Protein, urine, 24 hour     Status: None   Collection Time: 07/24/14  6:30 PM  Result Value Ref Range   Urine Total Volume-UPROT 1125 mL   Collection Interval-UPROT URINE, RANDOM hours    Comment: CORRECTED ON 06/24 AT 1832: PREVIOUSLY REPORTED AS 24   Protein, Urine SEE [id number] FOR REPORT mg/dL   Protein, 24H Urine        50 - 100 mg/day    Comment: RESULT BELOW REPORTABLE RANGE, UNABLE TO CALCULATE.   Miscellaneous [facility] test (send-out)     Status: None   Collection Time: 07/24/14  6:30 PM  Result Value Ref Range   [facility] test code [id number]    [facility] test name protein total urine    Misc [facility] result SEE [id number] FOR REPORT.   Protein, urine, 24 hour     Status: Abnormal   Collection Time: 07/24/14  6:30 PM  Result Value Ref Range   Urine Total Volume-UPROT  1125 mL   Collection Interval-UPROT 24 hours   Protein, Urine 11 mg/dL    Comment: PERFORMED BY [facility]   Protein, 24H Urine 124 (H) 50 - 100 mg/day  Protein / creatinine ratio, urine     Status: Abnormal   Collection Time: 07/27/14 10:41 AM  Result Value Ref Range   Creatinine, Urine 212 mg/dL   Total Protein, Urine 185 mg/dL    Comment: NO NORMAL RANGE ESTABLISHED FOR THIS TEST   Protein Creatinine Ratio 0.87 (H) 0.00 - 0.15 mg/mg[Cre]  CBC     Status: Abnormal   Collection Time: 07/27/14 10:52 AM  Result Value Ref Range   WBC 15.4 (H) 3.6 - 11.0 K/uL   RBC 3.78 (L) 3.80 - 5.20 MIL/uL   Hemoglobin 11.4 (L) 12.0 - 16.0 g/dL   HCT 34.4 (L) 35.0 - 47.0 %   MCV 91.0 80.0 - 100.0 fL   MCH 30.3 26.0 - 34.0 pg   MCHC 33.3 32.0 - 36.0 g/dL   RDW 13.0 11.5 - 14.5 %   Platelets 106 (L) 150 - 440 K/uL  Comprehensive metabolic panel     Status: Abnormal   Collection Time: 07/27/14 10:52 AM  Result Value Ref Range   Sodium 138 135 - 145 mmol/L   Potassium 3.6 3.5 - 5.1 mmol/L   Chloride 109 101 - 111 mmol/L   CO2 21 (L) 22 - 32  mmol/L   Glucose, Bld 74 65 - 99 mg/dL   BUN 11 6 - 20 mg/dL   Creatinine, Ser 0.51 0.44 - 1.00 mg/dL   Calcium 8.4 (L) 8.9 - 10.3 mg/dL   Total Protein 5.8 (L) 6.5 - 8.1 g/dL   Albumin 2.6 (L) 3.5 - 5.0 g/dL   AST 45 (H) 15 - 41 U/L   ALT 40 14 - 54 U/L   Alkaline Phosphatase 75 38 - 126 U/L   Total Bilirubin 0.6 0.3 - 1.2 mg/dL   GFR calc non Af Amer >60 >60 mL/min   GFR calc Af Amer >60 >60 mL/min    Comment: (NOTE) The eGFR has been calculated using the CKD EPI equation. This calculation has not been validated in all clinical situations. eGFR's persistently <60 mL/min signify possible Chronic Kidney Disease.    Anion gap 8 5 - 15                                 Ultrasound: See note From Dr. Diamantina Monks from [facility]  Assessment:  Vanessa Chen is a 24 y.o. [phone] female at 26w2dnow with preeclampsia in the setting of growth restriction (<3rd %ile)  This patient presents a complicated management scenario. I have spoken with Dr. MManfred Shirtswho has already evaluated the patient today, and updated her with her vital signs and lab results. Based on discussion with her, [facility] T  Plan:  1.Start IV prior to transfer; per Dr GClaybon JabsI will hold magnesium sulfate for now. [facility] 3. I have explained POM to patient and partner and they are in agreement with plan.   \Dalia Heading CLandis            Routing History

## 2014-07-27 NOTE — Discharge Summary (Signed)
Pt transferred to [facility] via [facility]. Report given to [facility] RN and [facility] receiving RN, Hurshel Party. Upon d/c pt was in stable condition. Final vital signs WDL. Pt denies any other needs at this time.

## 2014-07-27 NOTE — Progress Notes (Signed)
MFM Follow up (ultrasound report and recommendations)  Single intrauterine pregnancy with an estimated gestational age of [redacted] weeks 2 day(s).  Dating assigned by LMP concordant with ultrasound performed at [facility] on 04/06/2014; measurements at that exam reported as  13 weeks 2 days. Her history is significant for previous pregnancy complicated by severe, preeterm preeclampsia (34 week delivery). She was admitted to [facility] for 2 days due to gestational hypertension.   The patient returned to [facility] on 07/16/2014, Then the EFW was 760g, which was < the 3rd percentile.  there was mild oligohydramnios wiht an AFI of 4.7 with normal dopplers.  The fetus is cephalic.  The BPP is 8/8 and the AFI is 7.6.  A 2x2 cm pocket off fluid is visualized.      Her blood pressure is 169/112 (repeat).  She was admitted to [facility] for bp monitoring and 24h urine.  Her p:c ratio was negative, serial bps normalized, and she received steriods for fetal lung maturation.  She reports no clinical sxs c/w severe preeclampsia.   She will be referred to labor and delivery for further evalaution/preeclampsia labs and blood pressure evaluation.  If her 24 hour urine is consistent with preeclampsia, she may need inpatient, tertiary care surveillance.  If the 24 hour urine is normal, she may continue with close outpatient surveillance, however, she will likely need to check her blood pressure at home, or have it checked daily as an outpatient. Delivery should occur at 34 weeks (sooner, if indicated based on maternal and neonatal testing).  If the fetal weight rises above the 5th percentile, and maternal/fetal testing is stable, delivery may be delayed until after [redacted] weeks gestation.

## 2014-07-27 NOTE — Progress Notes (Signed)
[facility] called at [phone] for transport to [facility].

## 2014-07-27 NOTE — Progress Notes (Signed)
Copies of SIX ultrasounds in reverse chronological order. She is a [employer] patient but has not had a prenatal visit with their practice. She has seen [facility] providers when in labor and delivery only.     1. 07/27/14:  Indication: oligohydraminos, IUGR, obesity.  ____________________________________________________________________________ History: Age: 24 years. ____________________________________________________________________________ Dating: LMP: 01/03/2014 EDC: 10/10/2014 GA by LMP: [redacted]w[redacted]d Best Overall Assessment: 05/21/2014 EDC: 10/10/2014 Assessed GA: [redacted]w[redacted]d The Best Overall Assessment is based on the LMP. ____________________________________________________________________________ Fetal Wellbeing Assessment: Amniotic fluid: AFI: 7.9 cm. MVP: 4.2 cm. Q1: 4.2 cm. Q2: 2.9 cm. Q3: 0.4 cm. Q4: 0.4 cm.  Non Stress Test: Fetal heart rate: 147 bpm.  Biophysical Profile: Fetal body movements: normal (2), Fetal tone: normal (2), Fetal breathing movements: normal (2), Amniotic fluid volume: abnormal (0). Score 6 / 8.   ____________________________________________________________________________ Report Summary: Impression: Single intrauterine pregnancy with an estimated gestational age of [redacted] weeks 2 day(s).  Dating assigned by LMP concordant with ultrasound performed at [facility] on 04/06/2014; measurements at that exam reported as  13 weeks 2 days. Her history is significant for previous pregnancy complicated by severe, preeterm preeclampsia (34 week delivery). She was admitted to [facility] for 2 days due to gestational hypertension.   The patient returned to [facility] on 07/16/2014, Then the EFW was 760g, which was < the 3rd percentile.  there was mild oligohydramnios wiht an AFI of 4.7 with normal dopplers.  The fetus is cephalic.  The BPP is 8/8 and the AFI is 7.6.  A 2x2 cm pocket off fluid is visualized.      Her blood pressure is 169/112 (repeat).  She was admitted to [facility] for  bp monitoring and 24h urine.  Her p:c ratio was negative, serial bps normalized, and she received steriods for fetal lung maturation.  She reports no clinical sxs c/w severe preeclampsia.   She will be referred to labor and delivery for further evalaution/preeclampsia labs and blood pressure evaluation.  If her 24 hour urine is consistent with preeclampsia, she may need inpatient, tertiary care surveillance.  If the 24 hour urine is normal, she may continue with close outpatient surveillance, however, she will likely need to check her blood pressure at home, or have it checked daily as an outpatient. Delivery should occur at 34 weeks (sooner, if indicated based on maternal and neonatal testing).  If the fetal weight rises above the 5th percentile, and maternal/fetal testing is stable, delivery may be delayed until after [redacted] weeks gestation. CODING DESCRIPTION: fetal growth restriction, gestational hypertension.  Recommendations:   Thank you for allowing [place] to participate in her care.  2, 6.23.16 Indication: Intrauterine Growth Restriction, Oligohydraminos.  ____________________________________________________________________________ History: Age: 24 years. ____________________________________________________________________________ Dating: LMP: 01/03/2014 EDC: 10/10/2014 GA by LMP: [redacted]w[redacted]d Best Overall Assessment: 05/21/2014 EDC: 10/10/2014 Assessed GA: [redacted]w[redacted]d The Best Overall Assessment is based on the LMP. ____________________________________________________________________________ Anatomy Scan: Singleton gestation.  Fetal heart activity: present. Fetal heart rate: 131 bpm.  Fetal presentation: cephalic.   ____________________________________________________________________________ Fetal Wellbeing Assessment: Amniotic fluid: oligohydramnios. AFI: 6.1 cm. MVP: 2.5 cm. Q1: 1.5 cm. Q3: 2.1 cm. Q4: 2.5 cm.  Biophysical Profile: Fetal body movements: normal (2), Fetal tone: normal (2), Fetal  breathing movements: normal (2), Amniotic fluid volume: normal (2). Score 8 / 8.   ____________________________________________________________________________ Doppler: Fetal Doppler: Umbilical Artery: PS 36.8 cm/s   ED 10.04 cm/s   S/D ratio 3.68   .  ____________________________________________________________________________ Report Summary: Impression: Single intrauterine pregnancy with an estimated gestational age of [redacted] weeks  5 day(s).  Dating assigned by LMP concordant with ultrasound performed at [facility] on 04/06/2014; measurements at that exam reported as  13 weeks 2 days. Her history is significant for previous pregnancy complicated by severe, preeterm preeclampsia (34 week delivery).   The patient returned to [facility] on 07/16/2014, Then the EFW was 760g, which was < the 3rd percentile.  there was mild oligohydramnios wiht an AFI of 4.7 with normal dopplers.  Today BPP is 8/8 and the AFI is 6.6.  A 2x2 cm pocket off fluid is visualized.    Umbilical artery dopplers are at the upper limit of normal, with an S/D ratio of 3.6 (upper limit of normal for gestational age is 3.7)  Her blood pressure is 150/83 (repeat).  She denies clinical symptoms associated with severe preeclampsia. She will be referred to labor and delivery for further evalaution/preeclampsia labs and blood pressure evaluation. CODING DESCRIPTION: fetal growth restriction, history of severe preeclampsia.  Recommendations:   Thank you for allowing [place] to participate in her care.  3.Indication: BBP/AFI, IUGR, Oligohydramnios.  ____________________________________________________________________________ History: Age: 24 years. ____________________________________________________________________________ Dating: LMP: 01/03/2014 EDC: 10/10/2014 GA by LMP: [redacted]w[redacted]d Best Overall Assessment: 05/21/2014 EDC: 10/10/2014 Assessed GA: [redacted]w[redacted]d The Best Overall Assessment is based on the  LMP. ____________________________________________________________________________ Anatomy Scan: Singleton gestation.  Fetal heart activity: present. Fetal heart rate: 134 bpm.   Fetal Anatomy: Visualized with normal appearance: gastrointestinal tract, bladder.   ____________________________________________________________________________ Fetal Wellbeing Assessment: Amniotic fluid: oligohydramnios. AFI: 8.7 cm. MVP: 3.3 cm. Q1: 3.3 cm. Q2: 2.5 cm. Q3: 1.1 cm. Q4: 1.8 cm.  Non Stress Test: Fetal heart rate: 134 bpm.  Biophysical Profile: Fetal body movements: normal (2), Fetal tone: normal (2), Fetal breathing movements: normal (2), Amniotic fluid volume: normal (2). Score 8 / 8.   ____________________________________________________________________________ Report Summary: Impression: Single intrauterine pregnancy with an estimated gestational age of [redacted] weeks 2 day(s).  Dating assigned by LMP concordant with ultrasound performed at [facility] on 04/06/2014; measurements at that exam reported as  13 weeks 2 days.  The patient returned to [facility] on 07/16/2014, Then the EFW was 760g, which was < the 3rd percentile.  there was mild oligohydramnios wiht an AFI of 4.7 with normal dopplers.  Today BPP is 8/8 and the AFI is  improved at 8.6. CODING DESCRIPTION: FGR.  Recommendations: Pt is scheduled for BPP and dopplers Thursday   Thank you for allowing [place] to participate in her care.  4. ndication: Follow Up, Obesity, Heart Views.  ____________________________________________________________________________ History: Age: 24 years. ____________________________________________________________________________ Dating: LMP: 01/03/2014 EDC: 10/10/2014 GA by LMP: [redacted]w[redacted]d Current Scan on: 07/16/2014 EDC: 10/20/2014 GA by current scan: [redacted]w[redacted]d Best Overall Assessment: 05/21/2014 EDC: 10/10/2014 Assessed GA: [redacted]w[redacted]d The calculation of the gestational age by current scan was based on BPD, HC, AC, FL  and HUM. The Best Overall Assessment is based on the LMP. ____________________________________________________________________________ Anatomy Scan: Singleton gestation. Biometry: BPD 65.4 mm  - [redacted]w[redacted]d ([redacted]w[redacted]d to [redacted]w[redacted]d) HC 240.4 mm  - [redacted]w[redacted]d ([redacted]w[redacted]d to [redacted]w[redacted]d) AC 183.5 mm  - [redacted]w[redacted]d ([redacted]w[redacted]d to [redacted]w[redacted]d) FL 50.3 mm  - [redacted]w[redacted]d ([redacted]w[redacted]d to [redacted]w[redacted]d) HUM 49.8 mm  - [redacted]w[redacted]d EFW (lbs/oz) 1 lbs 11 ozs EFW (g) 760 g <5th%   Fetal heart activity: present. Fetal heart rate: 152 bpm.  Fetal presentation: breech, posterior.  Amniotic fluid: oligohydramnios. AFI  4.7 cm. MVP 2.0 cm.  Cord: 3 vessels.  Placenta: anterior high. No previa.   Fetal Anatomy: Head: Appears normal.  Brain: visualized previously.  Face: visualized previously.  Spine: visualized previously Neck / Skin: visualized previously.  Thorax: Appears normal.  Heart: Appears normal.  Abdominal Wall: visualized previously.  Gastrointestinal Tract: Appears normal.  Kidneys / Adrenal Glands: Suboptimally visualized.  Bladder: Appears normal.  Genitalia: Visualized previously and was female.  Extremities: visualized previously.  Skeleton: Appears normal.  ____________________________________________________________________________ Doppler: Fetal Doppler: Umbilical Artery: S/D ratio 2.81   .  ____________________________________________________________________________ Report Summary: Impression: Single intrauterine pregnancy with an estimated gestational age of [redacted] weeks 5 day(s).  Dating assigned by LMP concordant with ultrasound performed at [facility] on 04/06/2014; measurements at that exam reported as  13 weeks 2 days.  The patient returned to [facility] on 05/21/2014 for detailed anatomy US.   With the exception of the left ventricular outflow tract of the fetal heart, which was suboptimally visualized due to maternal BMI > 40 and fetal position, the fetal anatomy was visualized and appeared normal.  The patient returns today for  follow-up of her detailed anatomy ultrasound.  Fetal anatomy was visualized today or visualized previously and was normal.    The EFW was 760g, which is < the 3rd percentile.  AFI was 4.7 cm.  MVP = 2.0 cm.  One 2 cm x 2 cm pocket was visualized.  Umbilical artery Doppler S/D ratio was 2.81.  The upper limit of normal is 4.55 at this gestational age. 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    She does not smoke, nor does she use drugs.    She was counseled that she should decrease her activity and rest on her side.  She was scheduled to return here to [facility] for twice weekly BPPs and AFIs with weekly Dopplers as well as a follow-up ultrasound for growth in 3 weeks.  I have recommended she transfer her care from [facility] to [facility].  I spoke to Dr. Tiburcio Pea.  Since the patient will have her testing here, at least in the short term, the patient can be seen in their [place] office and should be seen there in the next week.    Thank you for allowing [place] to participate in her care.  5. Indication: anatomy.  ____________________________________________________________________________ History: Age: 14 years. ____________________________________________________________________________ Dating: LMP: 01/03/2014 EDC: 10/10/2014 GA by LMP: [redacted]w[redacted]d Current Scan on: 05/21/2014 EDC: 10/13/2014 GA by current scan: [redacted]w[redacted]d Best Overall Assessment: 05/21/2014 EDC: 10/10/2014 Assessed GA: [redacted]w[redacted]d The calculation of the gestational age by current scan was based on BPD, HC, AC, FL and HUM. The Best Overall Assessment is based on the LMP. ____________________________________________________________________________ Anatomy Scan: Singleton gestation. Biometry: BPD 41.8 mm  - [redacted]w[redacted]d ([redacted]w[redacted]d to [redacted]w[redacted]d) HC 156.8 mm  - [redacted]w[redacted]d ([redacted]w[redacted]d to [redacted]w[redacted]d) AC 134.2 mm  - [redacted]w[redacted]d ([redacted]w[redacted]d to  [redacted]w[redacted]d) FL 32.4 mm  - [redacted]w[redacted]d ([redacted]w[redacted]d to [redacted]w[redacted]d) HUM 31.5 mm  - [redacted]w[redacted]d   Fetal heart activity: present. Fetal heart rate: 150 bpm.  Fetal presentation: variable, left.  Amniotic fluid: normal.  Cord: 3 vessels.  Placenta: anterior. No previa.   Fetal Anatomy: Head: Appears normal.  Brain: Appears normal.  Face: Appears normal.  Spine: Appears normal Neck / Skin: Appears normal.  Thorax: Appears normal.  Heart: The 4-chamber heart and RVOT was visualized normally. LVOT could not be adequately seen.  Abdominal Wall: Appears normal.  Gastrointestinal Tract: Appears normal.  Kidneys / Adrenal Glands: Appears normal.  Bladder: Appears normal.  Genitalia: Female  fetus.  Extremities: Appears normal.  Skeleton: Appears normal.  ____________________________________________________________________________ Maternal Structures: Cervical length 34 mm. ____________________________________________________________________________ Report Summary: Impression: Single intrauterine pregnancy with an estimated gestational age of [redacted] weeks 5 day(s).  Dating assigned by LMP concordant with [facility]; measurements at that exam reported as  13 weeks 2 days.  The patient returns today for detailed anatomy ultrasound.  With the exception of the left ventricular outflow tract of the fetal heart, which is suboptimally visualized due to maternal BMI > 40 and fetal position, the fetal anatomy was visualized and appears normal. CODING DESCRIPTION:Detailed anatomy ultrasound [id number]; obesity with BMI > 40.  Recommendations: The patient was scheduled to return in 8 weeks for follow-up ultrasound of the fetal heart and growth scan.    Given her BMI > 40, we would recommend monthly ultrasounds for growth starting at [redacted] weeks gestation, weekly testing at [redacted] weeks gestation and delivery at [redacted] weeks gestation.  Thank you for allowing us to participate in her care.  6. Indication:  First trimester screen.  ____________________________________________________________________________ History: Age: 1 years. ____________________________________________________________________________ Dating: LMP: 01/03/2014 EDC: 10/10/2014 GA by LMP: [id number] Current Scan on: 04/06/2014 EDC: 10/13/2014 GA by current scan: [id number] Best Overall Assessment: 05/21/2014 EDC: 10/10/2014 Assessed GA: [id number] The calculation of the gestational age by current scan was based on CRL. The Best Overall Assessment is based on the LMP. ____________________________________________________________________________ First Trimester Scan: Singleton gestation. Biometry: CRL 67.0 mm  - [id number] NT 1.60 mm Fetal Anatomy: Skull / Brain: appears normal. Heart: appears normal. Abdomen: appears normal. Stomach: visible. Hands: both visible.    Fetal heart activity: present. Fetal heart rate: 147 bpm.  Amniotic fluid: normal.  Cord: normal. Placenta: anterior.   ____________________________________________________________________________ Maternal Structures: Cervical length 42 mm. ____________________________________________________________________________ Report Summary: Impression: Intrauterine pregnancy at 4013 2/7 weeks' gestation with best EDC of 10/10/2014 (based on LMP c/w today's scan ). Limited first trimester Fetal anatomy seen appears normal.  Ovaries appear normal  The NT measurement is 1.496mm . CODING DESCRIPTION: first tri screen.  Recommendations:  serum analytes obtained , consider MSAFP only at 15-19 weeks and aantomy scan at 17-19 w Thank you for allowing us to participate in her care.

## 2014-07-27 NOTE — H&P (Cosign Needed)
  See H&P on this date

## 2014-07-30 ENCOUNTER — Inpatient Hospital Stay: Admission: RE | Admit: 2014-07-30 | Payer: Self-pay | Source: Ambulatory Visit

## 2014-08-03 ENCOUNTER — Other Ambulatory Visit: Payer: Medicaid Other

## 2014-08-06 ENCOUNTER — Inpatient Hospital Stay: Admission: RE | Admit: 2014-08-06 | Payer: Self-pay | Source: Ambulatory Visit

## 2014-09-19 ENCOUNTER — Emergency Department
Admission: EM | Admit: 2014-09-19 | Discharge: 2014-09-19 | Disposition: A | Discharge status: Home / Self Care | Payer: Medicaid Other | Attending: Emergency Medicine | Admitting: Emergency Medicine

## 2014-09-19 DIAGNOSIS — K0889 Other specified disorders of teeth and supporting structures: Secondary | ICD-10-CM

## 2014-09-19 DIAGNOSIS — Z79899 Other long term (current) drug therapy: Secondary | ICD-10-CM | POA: Diagnosis not present

## 2014-09-19 DIAGNOSIS — K0381 Cracked tooth: Secondary | ICD-10-CM | POA: Diagnosis not present

## 2014-09-19 DIAGNOSIS — R6884 Jaw pain: Secondary | ICD-10-CM | POA: Diagnosis present

## 2014-09-19 DIAGNOSIS — K088 Other specified disorders of teeth and supporting structures: Secondary | ICD-10-CM | POA: Diagnosis not present

## 2014-09-19 MED ORDER — PENICILLIN V POTASSIUM 500 MG PO TABS: 500.0000 mg | ORAL_TABLET | Freq: Four times a day (QID) | ORAL | Status: DC

## 2014-09-19 MED ORDER — NAPROXEN 500 MG PO TBEC
500.0000 mg | DELAYED_RELEASE_TABLET | Freq: Two times a day (BID) | ORAL | Status: DC
Start: 2014-09-19 — End: 2023-11-09

## 2014-09-19 NOTE — Discharge Instructions (Signed)
Dental Pain Toothache is pain in or around a tooth. It may get worse with chewing or with cold or heat.  HOME CARE  Your dentist may use a numbing medicine during treatment. If so, you may need to avoid eating until the medicine wears off. Ask your dentist about this.  Only take medicine as told by your dentist or doctor.  Avoid chewing food near the painful tooth until after all treatment is done. Ask your dentist about this. GET HELP RIGHT AWAY IF:   The problem gets worse or new problems appear.  You have a fever.  There is redness and puffiness (swelling) of the face, jaw, or neck.  You cannot open your mouth.  There is pain in the jaw.  There is very bad pain that is not helped by medicine. MAKE SURE YOU:   Understand these instructions.  Will watch your condition.  Will get help right away if you are not doing well or get worse. Document Released: 07/05/2007 Document Revised: 04/10/2011 Document Reviewed: 07/05/2007 [facility] Patient Information 2015 [place], [place]. This information is not intended to replace advice given to you by your health care provider. Make sure you discuss any questions you have with your health care provider.  Take the prescription antibiotic as directed. Follow-up with one of the dental clinics listed below if symptoms persist.  OPTIONS FOR DENTAL FOLLOW UP CARE  [place] [facility] and [facility] - Local Safety Net Dental Clinics [email or web address]   [facility] [phone])  [facility] [phone])  [place] [phone])  [facility] [phone])  [facility] [phone]) This clinic caters to the indigent population and is on a lottery system. Location: [employer] [facility], [employer], [address], [place] Clinic Hours: Wednesdays from 6pm - 9pm, patients seen by a lottery  system. For dates, call or go to [email or web address] Services: Cleanings, fillings and simple extractions. Payment Options: DENTAL WORK IS FREE OF CHARGE. Bring proof of income or support. Best way to get seen: Arrive at 5:15 pm - this is a lottery, NOT first come/first serve, so arriving earlier will not increase your chances of being seen.     [facility] [phone] Select option 1 for emergencies   Location: [facility], [place], [address] [place] Clinic Hours: No walk-ins accepted - call the day before to schedule an appointment. Check in times are 9:30 am and 1:30 pm. Services: Simple extractions, temporary fillings, pulpectomy/pulp debridement, uncomplicated abscess drainage. Payment Options: PAYMENT IS DUE AT THE TIME OF SERVICE.  Fee is usually $100-200, additional surgical procedures (e.g. abscess drainage) may be extra. Cash, checks, Visa/MasterCard accepted.  Can file Medicaid if patient is covered for dental - patient should call case worker to check. No discount for [facility] patients. Best way to get seen: MUST call the day before and get onto the schedule. Can usually be seen the next 1-2 days. No walk-ins accepted.     [facility] [phone]   Location: [facility], [address] [place] Clinic Hours: M, W, Th, F 8am or 1:30pm, Tues 9a or 1:30 - first come/first served. Services: Simple extractions, temporary fillings, uncomplicated abscess drainage.  You do not need to be an [facility] resident. Payment Options: PAYMENT IS DUE AT THE TIME OF SERVICE. Dental insurance, otherwise sliding scale - bring proof of income or support. Depending on income and  treatment needed, cost is usually $50-200. Best way to get seen: Arrive early as it is first come/first served.     [facility] [phone]    Location: [address] Clinic Hours: Mon-Thu 8a-5p Services: Most basic dental services including extractions and fillings. Payment Options: PAYMENT IS DUE AT THE TIME OF SERVICE. Sliding scale, up to 50% off - bring proof if income or support. Medicaid with dental option accepted. Best way to get seen: Call to schedule an appointment, can usually be seen within 2 weeks OR they will try to see walk-ins - show up at 8a or 2p (you may have to wait).     [facility] [phone] [place] RESIDENTS ONLY   Location: [facility], [address] [place], [place] [address] Clinic Hours: By appointment only. Monday - Thursday 8am-5pm, Friday 8am-12pm Services: Cleanings, fillings, extractions. Payment Options: PAYMENT IS DUE AT THE TIME OF SERVICE. Cash, Visa or MasterCard. Sliding scale - $30 minimum per service. Best way to get seen: Come in to office, complete packet and make an appointment - need proof of income or support monies for each household member and proof of [facility] residence. Usually takes about a month to get in.     [facility] [phone]   Location: [address] [facility] Clinic Hours: Walk-in Urgent Care Dental Services are offered Monday-Friday mornings only. The numbers of emergencies accepted daily is limited to the number of providers available. Maximum 15 - Mondays, Wednesdays & Thursdays Maximum 10 - Tuesdays & Fridays Services: You do not need to be a [facility] resident to be seen for a dental emergency. Emergencies are defined as pain, swelling, abnormal bleeding, or dental trauma. Walkins will receive x-rays if needed. NOTE: Dental cleaning is not an emergency. Payment Options: PAYMENT IS DUE AT THE TIME OF SERVICE. Minimum co-pay is $40.00 for uninsured patients. Minimum co-pay is $3.00 for Medicaid with dental coverage. Dental Insurance is  accepted and must be presented at time of visit. Medicare does not cover dental. Forms of payment: Cash, credit card, checks. Best way to get seen: If not previously registered with the clinic, walk-in dental registration begins at 7:15 am and is on a first come/first serve basis. If previously registered with the clinic, call to make an appointment.     [facility] [phone] [place] RESIDENTS ONLY   Location: [address] [place], [place] Clinic Hours: Mon-Thu 10a-2p Services: Extractions only! Payment Options: FREE (donations accepted) - bring proof of income or support Best way to get seen: Call and schedule an appointment OR come at 8am on the 1st Monday of every month (except for holidays) when it is first come/first served.     [facility] [phone]   Location: [address] [place], [place] Clinic Hours: Friday mornings Services, Payment Options, Best way to get seen: Call for info

## 2014-09-19 NOTE — ED Notes (Signed)
Pt c/o right lower jaw pain, thinks it may possible be a tooth, started today.

## 2014-09-19 NOTE — ED Notes (Signed)
Pt states right side of face (specifically jaw area is throbbing) swollen and tender to touch; pt states pain radiates upwards towards nose and inner eye area at times.

## 2014-09-21 NOTE — ED Provider Notes (Signed)
[facility] Emergency Department Provider Note ____________________________________________  Time seen: 1625  I have reviewed the triage vital signs and the nursing notes.  HISTORY  Chief Complaint  Jaw Pain  HPI Vanessa Chen is a 24 y.o. female presents with c/o right-sided lower jaw pain today. She is concerned for possible dental abscess. She denies fever, chills, or sweats. She reports some radiation into the nose and sinuses.   Past Medical History  Diagnosis Date  . Pregnancy induced hypertension   . Obesity affecting pregnancy     Patient Active Problem List   Diagnosis Date Noted  . Elevated blood-pressure reading without diagnosis of hypertension 07/23/2014  . Elevated blood pressure affecting pregnancy in third trimester, antepartum 07/23/2014  . Small for gestational age fetus affecting management of mother 07/20/2014    Past Surgical History  Procedure Laterality Date  . Cesarean section  12/30/2004  . Cesarean section  02/06/2008  . Cesarean section  10/05/2009    Current Outpatient Rx  Name  Route  Sig  Dispense  Refill  . naproxen (EC NAPROSYN) 500 MG EC tablet   Oral   Take 1 tablet (500 mg total) by mouth 2 (two) times daily with a meal.   30 tablet   0   . penicillin v potassium (VEETID) 500 MG tablet   Oral   Take 1 tablet (500 mg total) by mouth 4 (four) times daily.   40 tablet   0   . Prenatal Vit-Fe Fumarate-FA (PRENATAL MULTIVITAMIN) TABS tablet   Oral   Take 1 tablet by mouth daily at 12 noon.          Allergies Review of patient's allergies indicates no known allergies.  No family history on file.  Social History Social History  Substance Use Topics  . Smoking status: Never Smoker   . Smokeless tobacco: Never Used  . Alcohol Use: No   Review of Systems  Constitutional: Negative for fever. Eyes: Negative for visual changes. ENT: Negative for sore throat. Dental pain as above Cardiovascular:  Negative for chest pain. Respiratory: Negative for shortness of breath. Gastrointestinal: Negative for abdominal pain, vomiting and diarrhea. Genitourinary: Negative for dysuria. Musculoskeletal: Negative for back pain. Skin: Negative for rash. Neurological: Negative for headaches, focal weakness or numbness. ____________________________________________  PHYSICAL EXAM:  VITAL SIGNS: ED Triage Vitals  Enc Vitals Group     BP 09/19/14 1429 130/69 mmHg     Pulse Rate 09/19/14 1429 61     Resp 09/19/14 1429 16     Temp 09/19/14 1429 98.2 F (36.8 C)     Temp Source 09/19/14 1429 Tympanic     SpO2 09/19/14 1429 99 %     Weight 09/19/14 1429 240 lb (108.863 kg)     Height 09/19/14 1429  (1.626 m)     Head Cir --      Peak Flow --      Pain Score 09/19/14 1430 5     Pain Loc --      Pain Edu? --      Excl. in GC? --    Constitutional: Alert and oriented. Well appearing and in no distress. Eyes: Conjunctivae are normal. PERRL. Normal extraocular movements. ENT   Head: Normocephalic and atraumatic.   Nose: No congestion/rhinnorhea.   Mouth/Throat: Mucous membranes are moist. Good dentition without gum edema, fluctuance, or gingivitis. Right lower 2nd molar with small chronic fracture to the posterior aspect.    Neck: Supple. No thyromegaly.  Hematological/Lymphatic/Immunilogical: No cervical lymphadenopathy. Cardiovascular: Normal rate, regular rhythm.  Respiratory: Normal respiratory effort.  Musculoskeletal: Nontender with normal range of motion in all extremities.  Neurologic:  Normal gait without ataxia. Normal speech and language. No gross focal neurologic deficits are appreciated. Skin:  Skin is warm, dry and intact. No rash noted. Psychiatric: Mood and affect are normal. Patient exhibits appropriate insight and judgment. ____________________________________________  INITIAL IMPRESSION / ASSESSMENT AND PLAN / ED COURSE  Dental & jaw pain. Treatment with  antibiotic prophylaxis for possible dental infection. Pen VK and Napryson prescriptions provided. Dental clinic list provided.  ____________________________________________  FINAL CLINICAL IMPRESSION(S) / ED DIAGNOSES  Final diagnoses:  Pain in lower jaw  Pain, dental     Lissa Hoard, PA-C 09/21/14 1734  Loleta Rose, MD 09/21/14 [phone]

## 2016-11-07 IMAGING — US US FETAL BPP W/O NONSTRESS
1 series · 14 of 14 positions shown · non-contrast
Comparison: none

[Series 1: us fetal bpp w/o nonstress · 0.28mm/px · 14 of 14 slices shown]
[im 1/14]
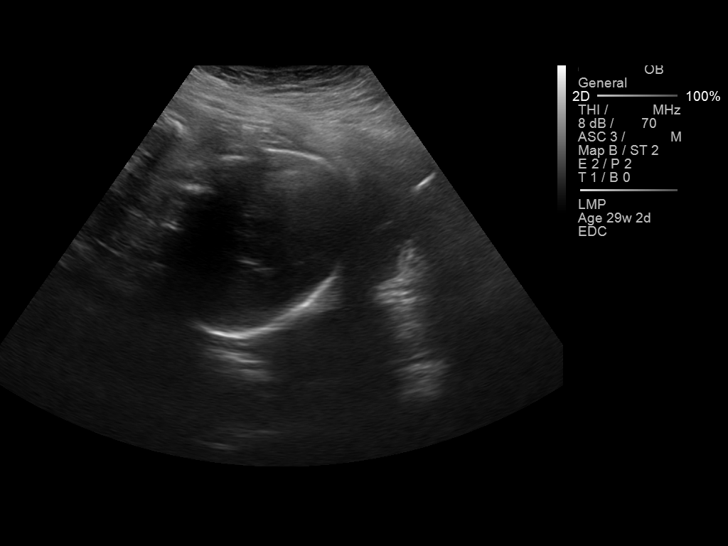
[im 2/14]
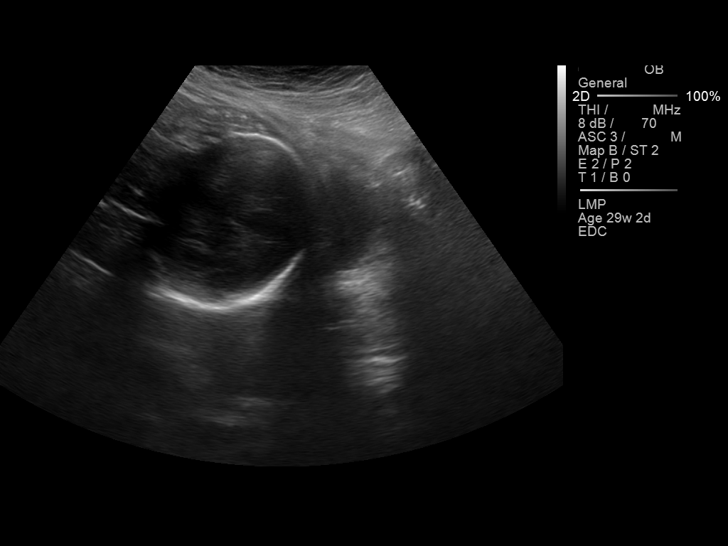
[im 3/14]
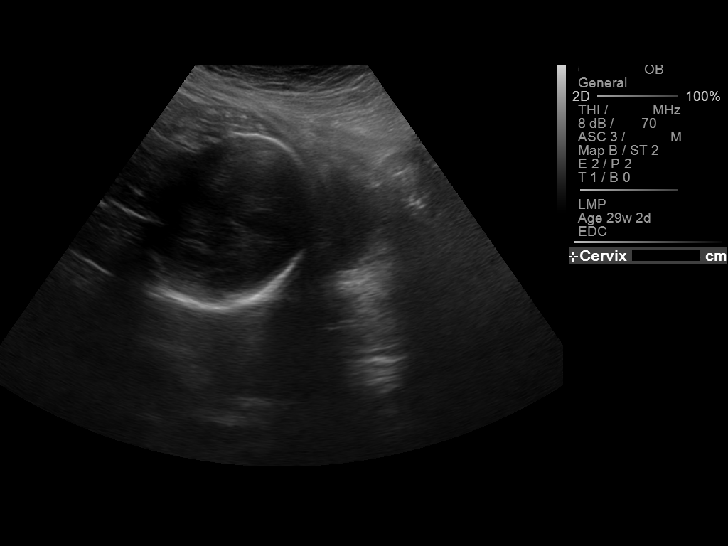
[im 4/14]
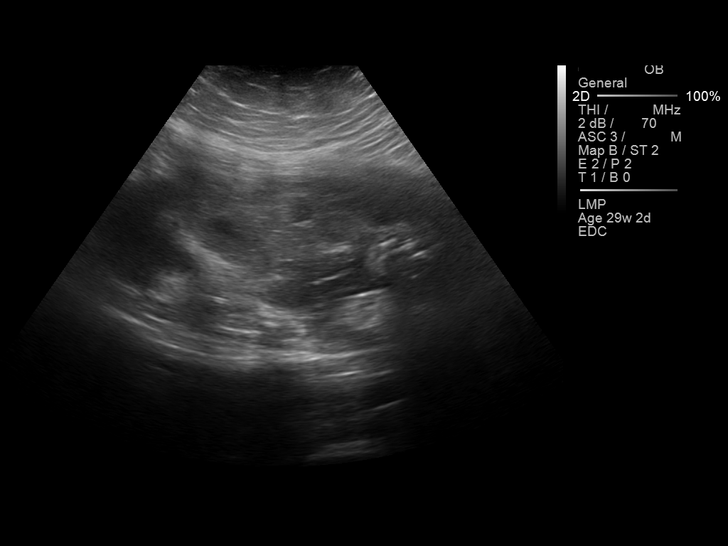
[im 5/14]
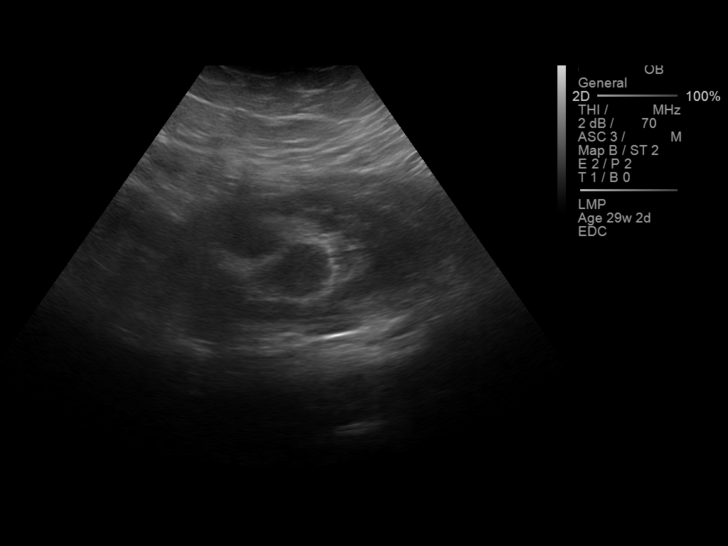
[im 6/14]
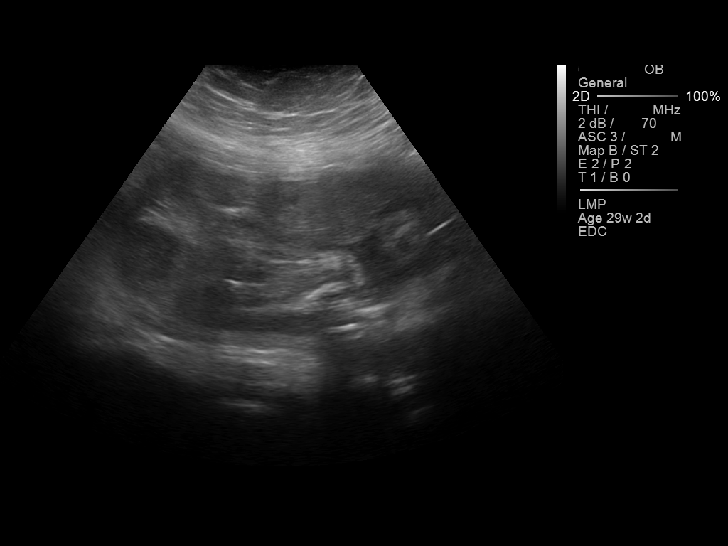
[im 7/14]
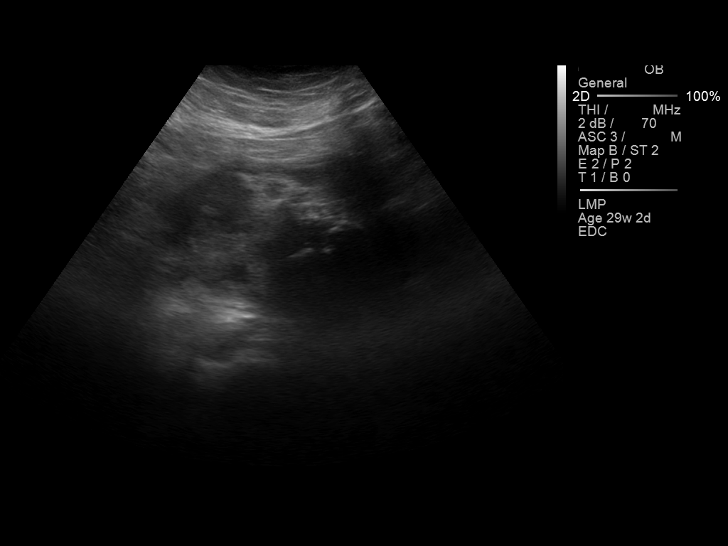
[im 8/14]
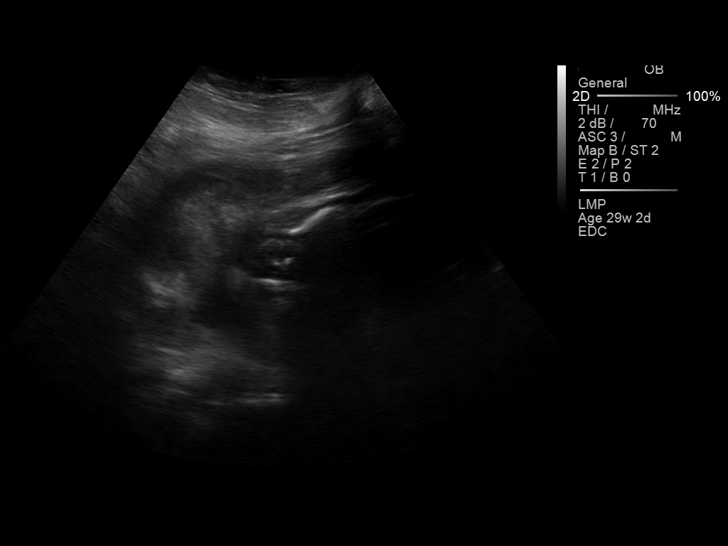
[im 9/14]
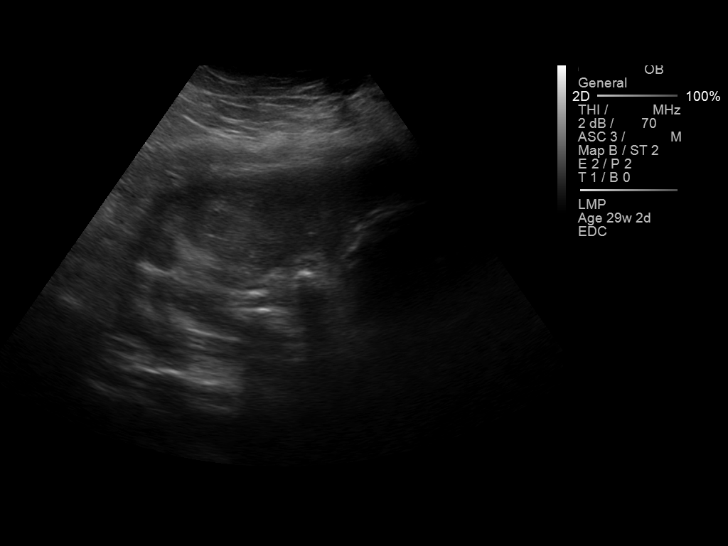
[im 10/14]
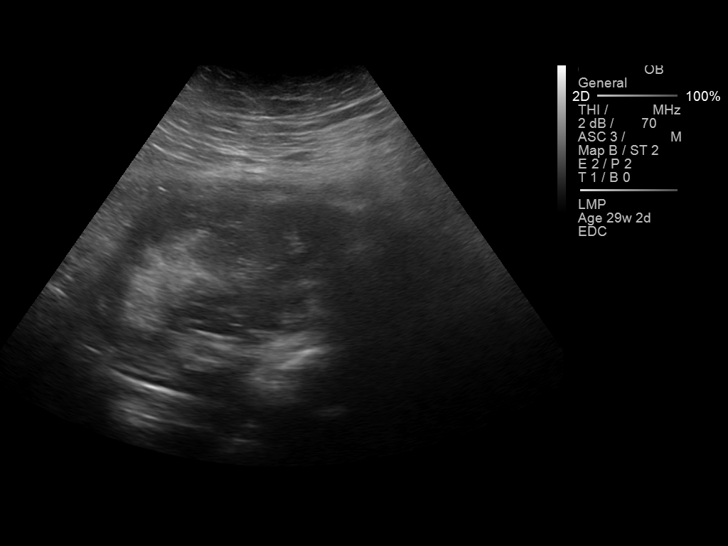
[im 11/14]
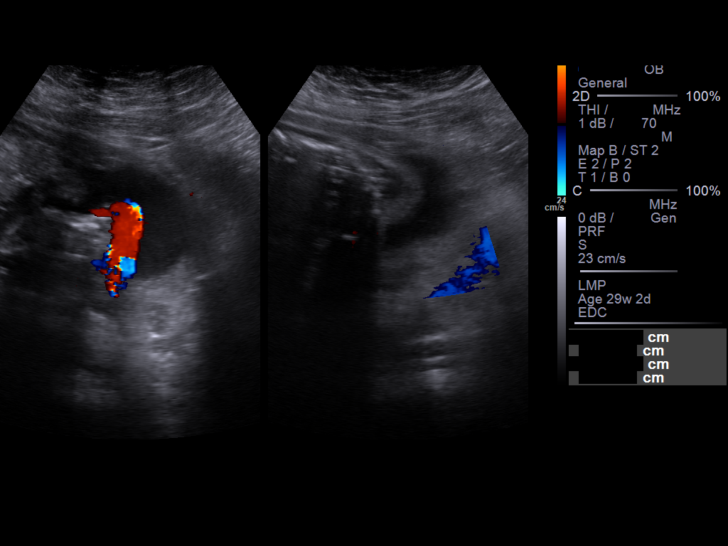
[im 12/14]
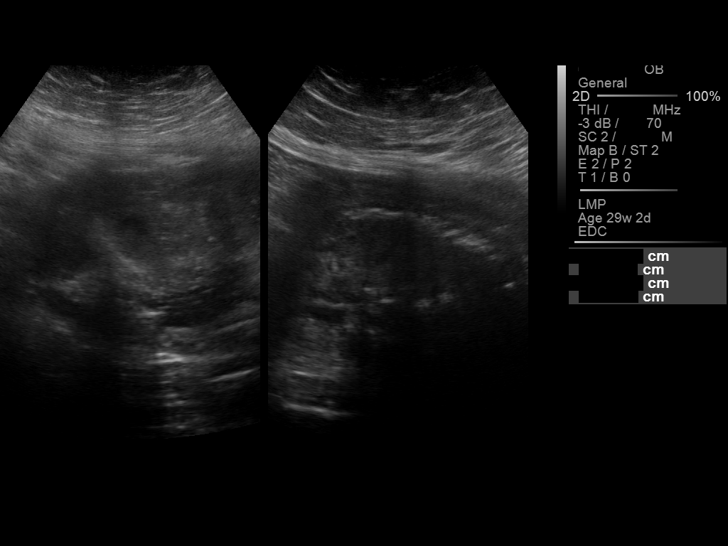
[im 13/14]
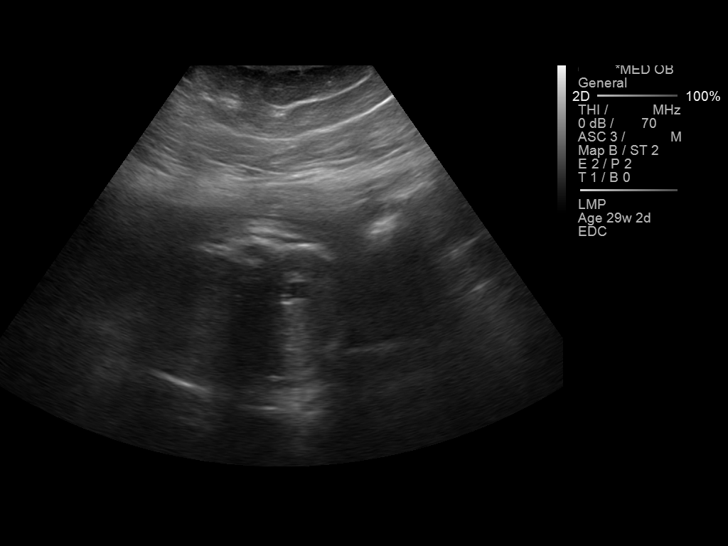
[im 14/14]
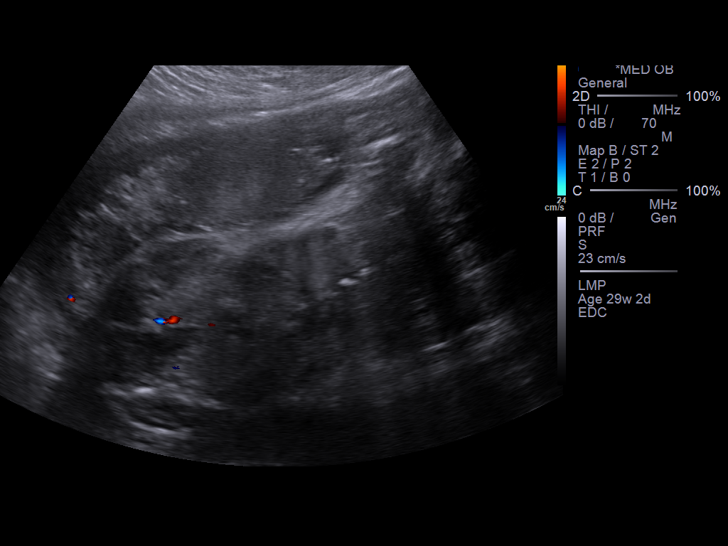

[14 of 14 positions shown; findings below may reference images not displayed]

Canned report from images found in remote index.

Refer to host system for actual result text.

## 2017-01-29 ENCOUNTER — Other Ambulatory Visit: Payer: Self-pay

## 2017-01-29 ENCOUNTER — Emergency Department
Admission: EM | Admit: 2017-01-29 | Discharge: 2017-01-29 | Disposition: A | Discharge status: Home / Self Care | Payer: Medicaid Other | Attending: Emergency Medicine | Admitting: Emergency Medicine

## 2017-01-29 ENCOUNTER — Emergency Department: Payer: Medicaid Other

## 2017-01-29 ENCOUNTER — Encounter: Payer: Self-pay | Admitting: Emergency Medicine

## 2017-01-29 DIAGNOSIS — N3001 Acute cystitis with hematuria: Secondary | ICD-10-CM | POA: Insufficient documentation

## 2017-01-29 DIAGNOSIS — Z79899 Other long term (current) drug therapy: Secondary | ICD-10-CM | POA: Diagnosis not present

## 2017-01-29 DIAGNOSIS — R350 Frequency of micturition: Secondary | ICD-10-CM | POA: Diagnosis present

## 2017-01-29 LAB — URINALYSIS, COMPLETE (UACMP) WITH MICROSCOPIC
Bilirubin Urine: NEGATIVE
GLUCOSE, UA: NEGATIVE mg/dL
KETONES UR: NEGATIVE mg/dL
Nitrite: NEGATIVE
PH: 5 (ref 5.0–8.0)
Protein, ur: 100 mg/dL — AB
SPECIFIC GRAVITY, URINE: 1.012 (ref 1.005–1.030)

## 2017-01-29 LAB — POCT PREGNANCY, URINE: Preg Test, Ur: NEGATIVE

## 2017-01-29 MED ORDER — OXYCODONE-ACETAMINOPHEN 5-325 MG PO TABS
1.0000 | ORAL_TABLET | Freq: Once | ORAL | Status: AC
Start: 2017-01-29 — End: 2017-01-29
  Administered 2017-01-29: 1 via ORAL
  Filled 2017-01-29: qty 1

## 2017-01-29 MED ORDER — ONDANSETRON HCL 4 MG PO TABS
4.0000 mg | ORAL_TABLET | Freq: Once | ORAL | Status: AC
  Administered 2017-01-29: 4 mg via ORAL
  Filled 2017-01-29: qty 1

## 2017-01-29 MED ORDER — ONDANSETRON HCL 4 MG PO TABS: 4.0000 mg | ORAL_TABLET | Freq: Every day | ORAL | 0 refills | Status: AC | PRN

## 2017-01-29 MED ORDER — PHENAZOPYRIDINE HCL 100 MG PO TABS: 100.0000 mg | ORAL_TABLET | Freq: Three times a day (TID) | ORAL | 0 refills | Status: AC | PRN

## 2017-01-29 MED ORDER — CEPHALEXIN 500 MG PO CAPS: 500.0000 mg | ORAL_CAPSULE | Freq: Two times a day (BID) | ORAL | 0 refills | Status: AC

## 2017-01-29 NOTE — ED Triage Notes (Addendum)
Pt reports severe lower back pain that radiates around to the bladder that started last night. Pt reports nausea, denies v/d. Pt reports urinary frequency and pain with urination, denies vaginal discharge.

## 2017-01-29 NOTE — ED Provider Notes (Signed)
[facility] Emergency Department Provider Note  ____________________________________________  Time seen: Approximately 4:01 PM  I have reviewed the triage vital signs and the nursing notes.   HISTORY  Chief Complaint Urinary Frequency    HPI Vanessa Chen is a 26 y.o. female that presents to the emergency department for evaluation of low back pain, dysuria, urgency of urination for 1 day.  Patient states that back pain radiates to her pelvis.  She is nauseous but has not vomited.  She has never had a kidney stone.  She denies fever, chills, vomiting, hematuria.  Past Medical History:  Diagnosis Date  . Obesity affecting pregnancy   . Pregnancy induced hypertension     Patient Active Problem List   Diagnosis Date Noted  . Elevated blood-pressure reading without diagnosis of hypertension 07/23/2014  . Elevated blood pressure affecting pregnancy in third trimester, antepartum 07/23/2014  . Small for gestational age fetus affecting management of mother 07/20/2014    Past Surgical History:  Procedure Laterality Date  . CESAREAN SECTION  12/30/2004  . CESAREAN SECTION  02/06/2008  . CESAREAN SECTION  10/05/2009    Prior to Admission medications   Medication Sig Start Date End Date Taking? Authorizing Provider  cephALEXin (KEFLEX) 500 MG capsule Take 1 capsule (500 mg total) by mouth 2 (two) times daily for 10 days. 01/29/17 02/08/17  Enid DerryWagner, Deddrick Saindon, PA-C  naproxen (EC NAPROSYN) 500 MG EC tablet Take 1 tablet (500 mg total) by mouth 2 (two) times daily with a meal. 09/19/14   Menshew, Charlesetta IvoryJenise V Bacon, PA-C  ondansetron (ZOFRAN) 4 MG tablet Take 1 tablet (4 mg total) by mouth daily as needed for nausea or vomiting. 01/29/17 01/29/18  Enid DerryWagner, Yuvaan Olander, PA-C  penicillin v potassium (VEETID) 500 MG tablet Take 1 tablet (500 mg total) by mouth 4 (four) times daily. 09/19/14   Menshew, Charlesetta IvoryJenise V Bacon, PA-C  phenazopyridine (PYRIDIUM) 100 MG tablet Take 1 tablet (100 mg  total) by mouth 3 (three) times daily as needed for up to 2 days for pain. 01/29/17 01/31/17  Enid DerryWagner, Marylu Dudenhoeffer, PA-C  Prenatal Vit-Fe Fumarate-FA (PRENATAL MULTIVITAMIN) TABS tablet Take 1 tablet by mouth daily at 12 noon.    [provider]    Allergies Patient has no known allergies.  No family history on file.  Social History Social History   Tobacco Use  . Smoking status: Never Smoker  . Smokeless tobacco: Never Used  Substance Use Topics  . Alcohol use: No  . Drug use: No     Review of Systems  Constitutional: No fever/chills Cardiovascular: No chest pain. Respiratory: No SOB. Gastrointestinal: No vomiting.  Genitourinary: Positive for dysuria. Skin: Negative for rash, abrasions, lacerations, ecchymosis.   ____________________________________________   PHYSICAL EXAM:  VITAL SIGNS: ED Triage Vitals  Enc Vitals Group     BP 01/29/17 1510 (!) 131/102     Pulse Rate 01/29/17 1510 93     Resp 01/29/17 1510 16     Temp 01/29/17 1510 98.6 F (37 C)     Temp Source 01/29/17 1510 Oral     SpO2 01/29/17 1510 100 %     Weight 01/29/17 1510 235 lb (106.6 kg)     Height 01/29/17 1510 5\' 4"  (1.626 m)     Head Circumference --      Peak Flow --      Pain Score 01/29/17 1509 6     Pain Loc --      Pain Edu? --  Excl. in GC? --      Constitutional: Alert and oriented. Well appearing and in no acute distress. Eyes: Conjunctivae are normal. PERRL. EOMI. Head: Atraumatic. ENT:      Ears:      Nose: No congestion/rhinnorhea.      Mouth/Throat: Mucous membranes are moist.  Neck: No stridor.   Cardiovascular: Normal rate, regular rhythm.  Good peripheral circulation. Respiratory: Normal respiratory effort without tachypnea or retractions. Lungs CTAB. Good air entry to the bases with no decreased or absent breath sounds. Gastrointestinal: Bowel sounds 4 quadrants. Soft and nontender to palpation. No guarding or rigidity. No palpable masses. No distention. No  CVA tenderness. Musculoskeletal: Full range of motion to all extremities. No gross deformities appreciated. Neurologic:  Normal speech and language. No gross focal neurologic deficits are appreciated.  Skin:  Skin is warm, dry and intact. No rash noted.   ____________________________________________   LABS (all labs ordered are listed, but only abnormal results are displayed)  Labs Reviewed  URINALYSIS, COMPLETE (UACMP) WITH MICROSCOPIC - Abnormal; Notable for the following components:      Result Value   Color, Urine YELLOW (*)    APPearance CLOUDY (*)    Hgb urine dipstick MODERATE (*)    Protein, ur 100 (*)    Leukocytes, UA LARGE (*)    Bacteria, UA FEW (*)    Squamous Epithelial / LPF 0-5 (*)    All other components within normal limits  POC URINE PREG, ED  POCT PREGNANCY, URINE   ____________________________________________  EKG   ____________________________________________  RADIOLOGY   Ct Renal Stone Study  Result Date: 01/29/2017 CLINICAL DATA:  Flank pain.  Evaluate for renal calculi. EXAM: CT ABDOMEN AND PELVIS WITHOUT CONTRAST TECHNIQUE: Multidetector CT imaging of the abdomen and pelvis was performed following the standard protocol without IV contrast. COMPARISON:  None FINDINGS: Lower chest: No acute abnormality. Hepatobiliary: No focal liver abnormality. Gallstones identified no gallbladder inflammation or biliary ductal dilatation. Pancreas: Unremarkable. No pancreatic ductal dilatation or surrounding inflammatory changes. Spleen: Normal in size without focal abnormality. Adrenals/Urinary Tract: The adrenal glands are normal. Unremarkable appearance of the right kidney. Left-sided pelviectasis and periureteral fat stranding. No ureteral calculi identified. No bladder stone identified. Stomach/Bowel: Stomach is within normal limits. Appendix appears normal. No evidence of bowel wall thickening, distention, or inflammatory changes. Vascular/Lymphatic: Normal  appearance of the abdominal aorta. No enlarged retroperitoneal or mesenteric adenopathy. No enlarged pelvic or inguinal lymph nodes. Reproductive: Uterus and bilateral adnexa are unremarkable. Other: No abdominal wall hernia or abnormality. No abdominopelvic ascites. Musculoskeletal: No acute or significant osseous findings. IMPRESSION: 1. There is left-sided pelviectasis and left-sided periureteral fat stranding. No stone identified within the urinary tract at this time. Findings may reflect sequelae of recently passed left ureteral calculus or perhaps urinary tract infection. Clinical correlation advised. Electronically Signed   By: Signa Kell M.D.   On: 01/29/2017 16:11    ____________________________________________    PROCEDURES  Procedure(s) performed:    Procedures    Medications  ondansetron [facility]) tablet 4 mg (4 mg Oral Given 01/29/17 1602)  oxyCODONE-acetaminophen (PERCOCET/ROXICET) 5-325 MG per tablet 1 tablet (1 tablet Oral Given 01/29/17 1602)     ____________________________________________   INITIAL IMPRESSION / ASSESSMENT AND PLAN / ED COURSE  Pertinent labs & imaging results that were available during my care of the patient were reviewed by me and considered in my medical decision making (see chart for details).  Review of the [place] CSRS was performed in accordance  of the [facility] prior to dispensing any controlled drugs.   Patient's diagnosis is consistent with urinary tract infection.  Vital signs and exam are reassuring.  Urinalysis consistent with infection.  CT indicates urinary tract infection or recent renal stone.  Pain resolved with Zofran and Percocet.  Patient will be discharged home with prescriptions for Keflex, Pyridium, Zofran. Patient is to follow up with PCP as directed. Patient is given ED precautions to return to the ED for any worsening or new symptoms.     ____________________________________________  FINAL CLINICAL IMPRESSION(S) / ED  DIAGNOSES  Final diagnoses:  Acute cystitis with hematuria      NEW MEDICATIONS STARTED DURING THIS VISIT:  ED Discharge Orders        Ordered    cephALEXin (KEFLEX) 500 MG capsule  2 times daily     01/29/17 1624    phenazopyridine (PYRIDIUM) 100 MG tablet  3 times daily PRN     01/29/17 1624    ondansetron (ZOFRAN) 4 MG tablet  Daily PRN     01/29/17 1624          This chart was dictated using voice recognition software/Dragon. Despite best efforts to proofread, errors can occur which can change the meaning. Any change was purely unintentional.    Enid DerryWagner, Ronrico Dupin, PA-C 01/29/17 1807    Merrily Brittleifenbark, Neil, MD 01/29/17 1929

## 2018-10-13 IMAGING — CT CT RENAL STONE PROTOCOL
2 of 4 series · 16 of 46 positions shown, 18 images · non-contrast
Comparison: None

CLINICAL DATA: Flank pain.  Evaluate for renal calculi.

EXAM:
CT ABDOMEN AND PELVIS WITHOUT CONTRAST
TECHNIQUE: Multidetector CT imaging of the abdomen and pelvis was performed
following the standard protocol without IV contrast.

[Series 2: stone full standard · axial · 0.84mm/px · z∈[-526,-76]mm · 13 of 100 slices shown, 15 images]
[im 5/100  soft-tissue]
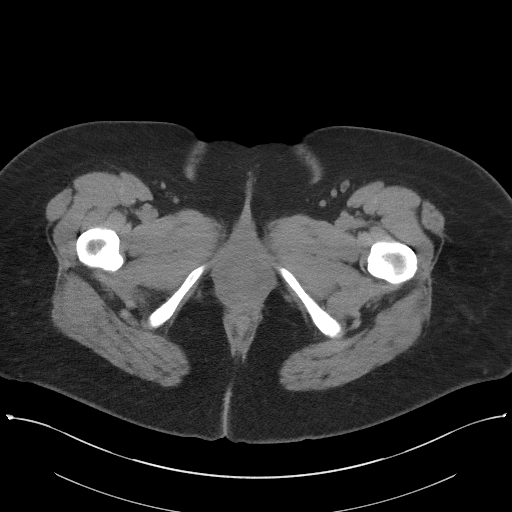
[im 5/100  bone]
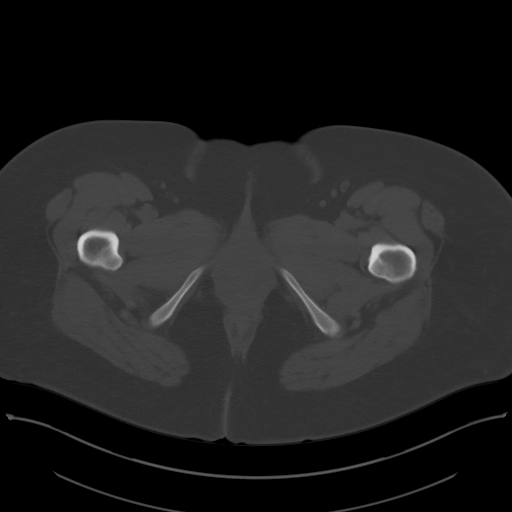
[im 13/100  soft-tissue]
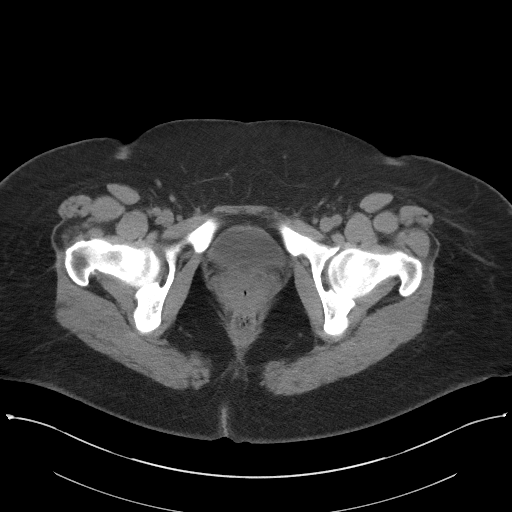
[im 21/100  soft-tissue]
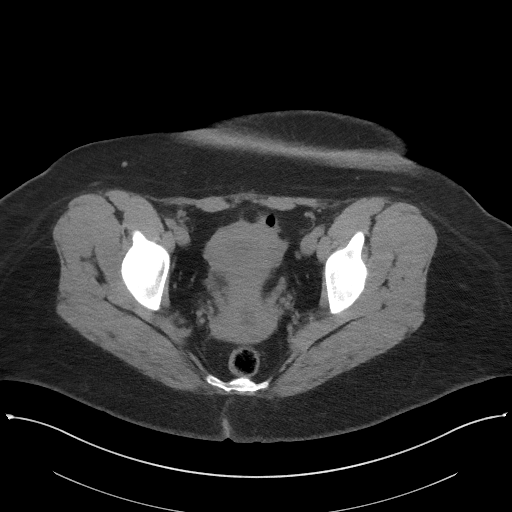
[im 29/100  soft-tissue]
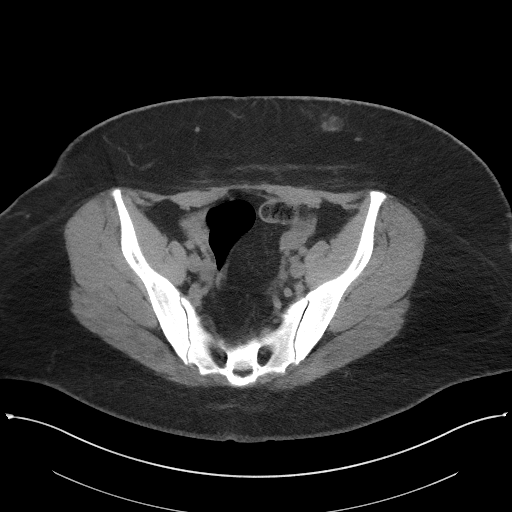
[im 34/100  soft-tissue]
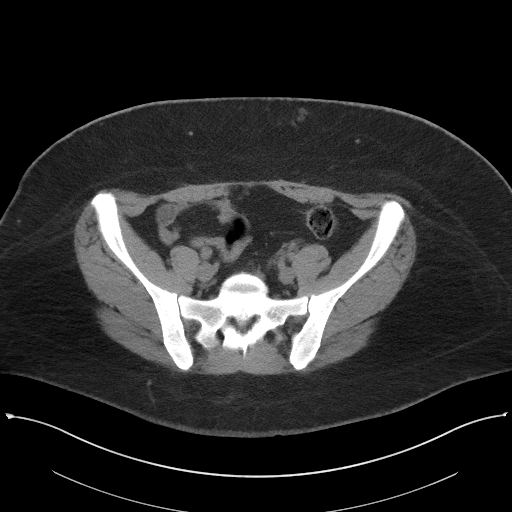
[im 42/100  soft-tissue]
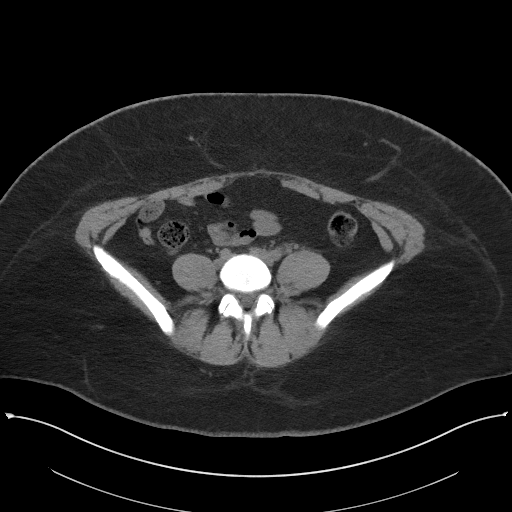
[im 50/100  soft-tissue]
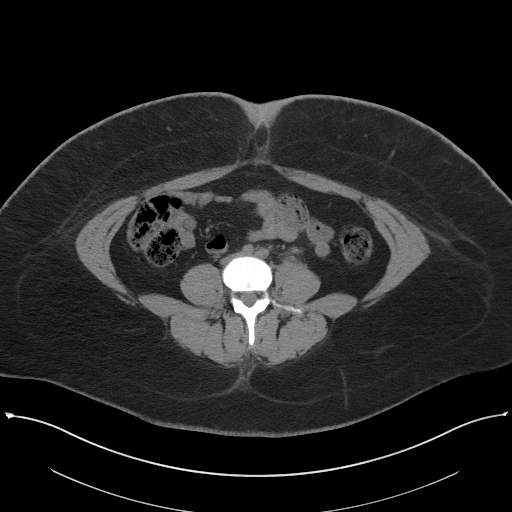
[im 58/100  soft-tissue]
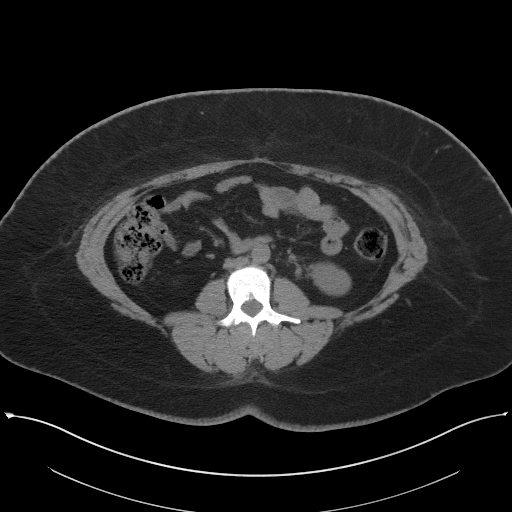
[im 67/100  soft-tissue]
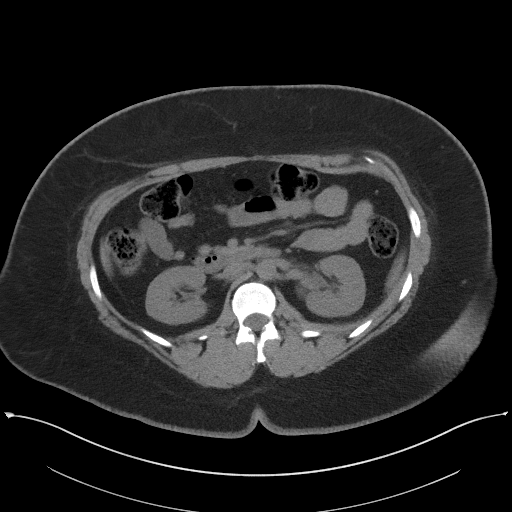
[im 67/100  bone]
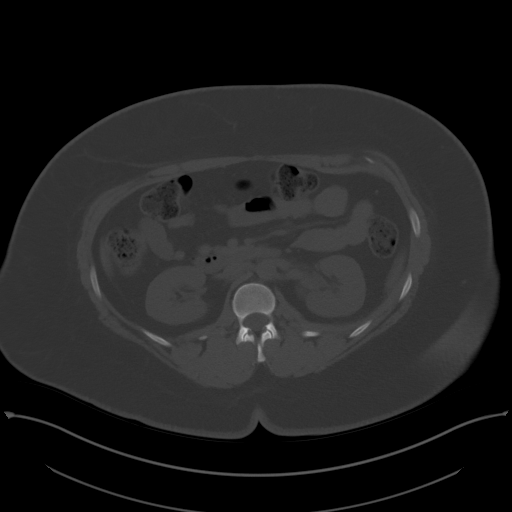
[im 71/100  soft-tissue]
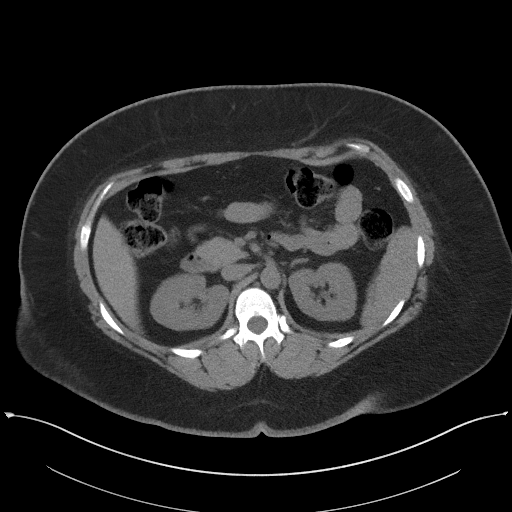
[im 79/100  soft-tissue]
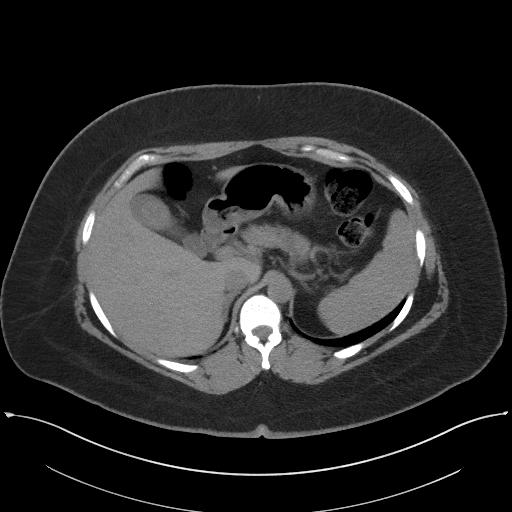
[im 87/100  soft-tissue]
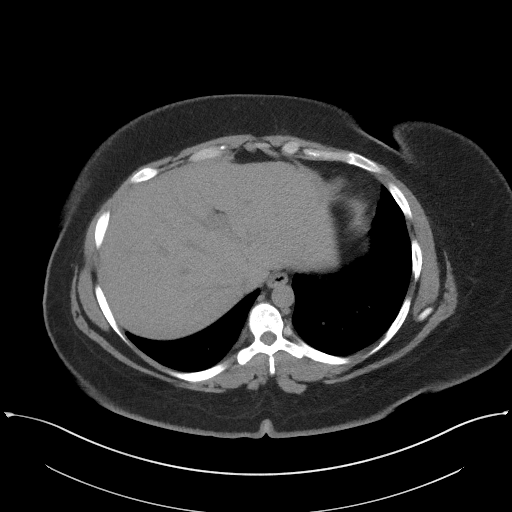
[im 95/100  soft-tissue]
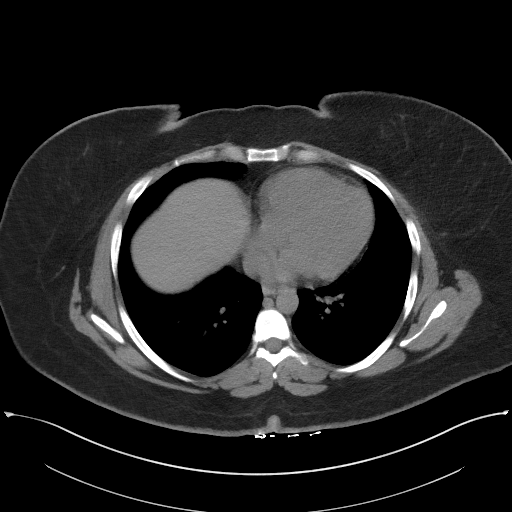

[Series 5: coronal · coronal · 0.85mm/px · 3 of 157 slices shown]
[im 53/157  soft-tissue]
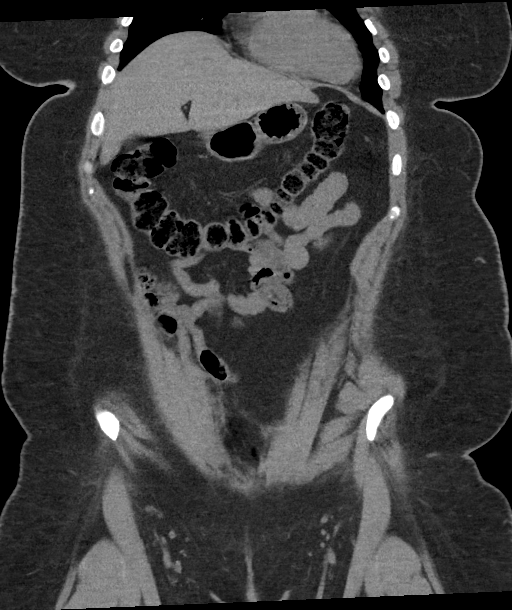
[im 70/157  soft-tissue]
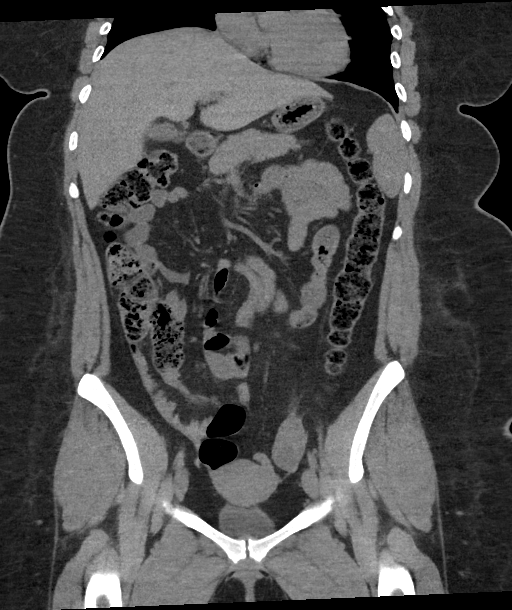
[im 87/157  soft-tissue]
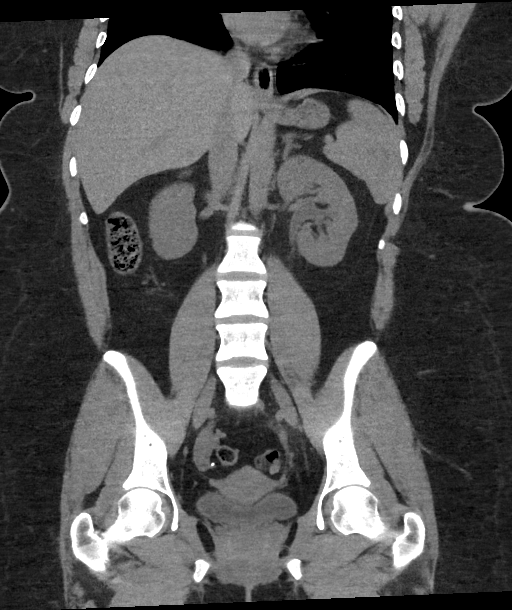

[16 of 46 positions shown; findings below may reference images not displayed]

FINDINGS: Lower chest: No acute abnormality.

Hepatobiliary: No focal liver abnormality. Gallstones identified no
gallbladder inflammation or biliary ductal dilatation.

Pancreas: Unremarkable. No pancreatic ductal dilatation or
surrounding inflammatory changes.

Spleen: Normal in size without focal abnormality.

Adrenals/Urinary Tract: The adrenal glands are normal. Unremarkable
appearance of the right kidney. Left-sided pelviectasis and
periureteral fat stranding. No ureteral calculi identified. No
bladder stone identified.

Stomach/Bowel: Stomach is within normal limits. Appendix appears
normal. No evidence of bowel wall thickening, distention, or
inflammatory changes.

Vascular/Lymphatic: Normal appearance of the abdominal aorta. No
enlarged retroperitoneal or mesenteric adenopathy. No enlarged
pelvic or inguinal lymph nodes.

Reproductive: Uterus and bilateral adnexa are unremarkable.

Other: No abdominal wall hernia or abnormality. No abdominopelvic
ascites.

Musculoskeletal: No acute or significant osseous findings.
IMPRESSION: 1. There is left-sided pelviectasis and left-sided periureteral fat
stranding. No stone identified within the urinary tract at this
time. Findings may reflect sequelae of recently passed left ureteral
calculus or perhaps urinary tract infection. Clinical correlation
advised.

## 2022-03-20 ENCOUNTER — Ambulatory Visit
Admission: EM | Admit: 2022-03-20 | Discharge: 2022-03-20 | Disposition: A | Discharge status: Home / Self Care | Payer: Medicaid Other | Attending: Emergency Medicine | Admitting: Emergency Medicine

## 2022-03-20 DIAGNOSIS — H1032 Unspecified acute conjunctivitis, left eye: Secondary | ICD-10-CM

## 2022-03-20 MED ORDER — POLYMYXIN B-TRIMETHOPRIM 10000-0.1 UNIT/ML-% OP SOLN: 1.0000 [drp] | Freq: Four times a day (QID) | OPHTHALMIC | 0 refills | Status: AC

## 2022-03-20 NOTE — ED Triage Notes (Addendum)
Patient to Urgent Care with complaints of left sided eye drainage/ redness that started this morning. Reports waking up with her eye matted shut. Denies any blurred vision.  Denies any fevers.

## 2022-03-20 NOTE — Discharge Instructions (Addendum)
Use the antibiotic eyedrops as prescribed.  Follow up with your primary care provider if your symptoms are not improving.   ? ? ?

## 2022-03-20 NOTE — ED Provider Notes (Signed)
Roderic Palau    CSN: [id number] Arrival date & time: 03/20/22  [id number]      History   Chief Complaint Chief Complaint  Patient presents with   Eye Problem    HPI Vanessa Chen is a 32 y.o. female.  Patient presents with left eye redness, drainage, crusting this morning.  The drainage this morning was yellow.  No eye trauma, vision change, eye pain, fever, chills, or other symptoms.  No treatments at home.  No pertinent medical history.    The history is provided by the patient and medical records.    Past Medical History:  Diagnosis Date   Obesity affecting pregnancy    Pregnancy induced hypertension     Patient Active Problem List   Diagnosis Date Noted   Elevated blood-pressure reading without diagnosis of hypertension 07/23/2014   Elevated blood pressure affecting pregnancy in third trimester, antepartum 07/23/2014   Small for gestational age fetus affecting management of mother 07/20/2014    Past Surgical History:  Procedure Laterality Date   CESAREAN SECTION  12/30/2004   CESAREAN SECTION  02/06/2008   CESAREAN SECTION  10/05/2009    OB History     Gravida  4   Para  3   Term  2   Preterm  1   AB  0   Living  3      SAB  0   IAB  0   Ectopic  0   Multiple  0   Live Births  3            Home Medications    Prior to Admission medications   Medication Sig Start Date End Date Taking? Authorizing Provider  trimethoprim-polymyxin b (POLYTRIM) ophthalmic solution Place 1 drop into both eyes 4 (four) times daily for 7 days. 03/20/22 03/27/22 Yes Sharion Balloon, NP  naproxen (EC NAPROSYN) 500 MG EC tablet Take 1 tablet (500 mg total) by mouth 2 (two) times daily with a meal. Patient not taking: Reported on 03/20/2022 09/19/14   Menshew, Dannielle Karvonen, PA-C  penicillin v potassium (VEETID) 500 MG tablet Take 1 tablet (500 mg total) by mouth 4 (four) times daily. Patient not taking: Reported on 03/20/2022 09/19/14   Menshew, Dannielle Karvonen,  PA-C  Prenatal Vit-Fe Fumarate-FA (PRENATAL MULTIVITAMIN) TABS tablet Take 1 tablet by mouth daily at 12 noon. Patient not taking: Reported on 03/20/2022    [provider]    Family History No family history on file.  Social History Social History   Tobacco Use   Smoking status: Never   Smokeless tobacco: Never  Substance Use Topics   Drug use: No     Allergies   Patient has no known allergies.   Review of Systems Review of Systems  Constitutional:  Negative for chills and fever.  HENT:  Negative for ear pain and sore throat.   Eyes:  Positive for discharge, redness and itching. Negative for pain and visual disturbance.  Respiratory:  Negative for cough and shortness of breath.   Gastrointestinal:  Negative for diarrhea and vomiting.  Skin:  Negative for color change and rash.  All other systems reviewed and are negative.    Physical Exam Triage Vital Signs ED Triage Vitals  Enc Vitals Group     BP 03/20/22 0941 122/80     Pulse Rate 03/20/22 0934 100     Resp 03/20/22 0934 18     Temp 03/20/22 0934 98.2 F (36.8 C)  Temp src --      SpO2 03/20/22 0934 98 %     Weight --      Height --      Head Circumference --      Peak Flow --      Pain Score 03/20/22 0933 0     Pain Loc --      Pain Edu? --      Excl. in [place]? --    No data found.  Updated Vital Signs BP 122/80   Pulse 100   Temp 98.2 F (36.8 C)   Resp 18   LMP 02/26/2022   SpO2 98%   Visual Acuity Right Eye Distance:   Left Eye Distance:   Bilateral Distance:    Right Eye Near:   Left Eye Near:    Bilateral Near:     Physical Exam Vitals and nursing note reviewed.  Constitutional:      General: She is not in acute distress.    Appearance: She is well-developed. She is not ill-appearing.  HENT:     Right Ear: Tympanic membrane normal.     Left Ear: Tympanic membrane normal.     Nose: Nose normal.     Mouth/Throat:     Mouth: Mucous membranes are moist.     Pharynx:  Oropharynx is clear.  Eyes:     General: Lids are normal. Vision grossly intact.        Right eye: No discharge.        Left eye: No discharge.     Extraocular Movements: Extraocular movements intact.     Conjunctiva/sclera:     Left eye: Left conjunctiva is injected.     Pupils: Pupils are equal, round, and reactive to light.  Cardiovascular:     Rate and Rhythm: Normal rate and regular rhythm.     Heart sounds: Normal heart sounds.  Pulmonary:     Effort: Pulmonary effort is normal. No respiratory distress.     Breath sounds: Normal breath sounds.  Musculoskeletal:     Cervical back: Neck supple.  Skin:    General: Skin is warm and dry.  Neurological:     Mental Status: She is alert.  Psychiatric:        Mood and Affect: Mood normal.        Behavior: Behavior normal.      UC Treatments / Results  Labs (all labs ordered are listed, but only abnormal results are displayed) Labs Reviewed - No data to display  EKG   Radiology No results found.  Procedures Procedures (including critical care time)  Medications Ordered in UC Medications - No data to display  Initial Impression / Assessment and Plan / UC Course  I have reviewed the triage vital signs and the nursing notes.  Pertinent labs & imaging results that were available during my care of the patient were reviewed by me and considered in my medical decision making (see chart for details).    Acute bacterial conjunctivitis of left eye.  Treating with Polytrim eyedrops.  Education provided on conjunctivitis.  Instructed patient to follow-up with her PCP if her symptoms are not improving.  ED precautions discussed.  Patient agrees to plan of care.   Final Clinical Impressions(s) / UC Diagnoses   Final diagnoses:  Acute bacterial conjunctivitis of left eye     Discharge Instructions      Use the antibiotic eyedrops as prescribed.    Follow-up with your primary care provider if  your symptoms are not  improving.          ED Prescriptions     Medication Sig Dispense Auth. Provider   trimethoprim-polymyxin b (POLYTRIM) ophthalmic solution Place 1 drop into both eyes 4 (four) times daily for 7 days. 10 mL Sharion Balloon, NP      PDMP not reviewed this encounter.   Sharion Balloon, NP 03/20/22 [phone]

## 2023-07-05 ENCOUNTER — Telehealth: Admitting: Physician Assistant

## 2023-07-05 DIAGNOSIS — R3989 Other symptoms and signs involving the genitourinary system: Secondary | ICD-10-CM | POA: Diagnosis not present

## 2023-07-05 MED ORDER — CEPHALEXIN 500 MG PO CAPS: 500.0000 mg | ORAL_CAPSULE | Freq: Two times a day (BID) | ORAL | 0 refills | Status: AC

## 2023-07-05 NOTE — Progress Notes (Signed)
 I have spent 5 minutes in review of e-visit questionnaire, review and updating patient chart, medical decision making and response to patient.   Laure Kidney, PA-C

## 2023-07-05 NOTE — Progress Notes (Signed)

## 2023-11-09 ENCOUNTER — Telehealth: Admitting: Family Medicine

## 2023-11-09 DIAGNOSIS — J069 Acute upper respiratory infection, unspecified: Secondary | ICD-10-CM | POA: Diagnosis not present

## 2023-11-09 MED ORDER — NAPROXEN 500 MG PO TABS: 500.0000 mg | ORAL_TABLET | Freq: Two times a day (BID) | ORAL | 0 refills | Status: AC

## 2023-11-09 NOTE — Progress Notes (Signed)
 E-Visit for Corona Virus Screening  Your current symptoms could be consistent with the coronavirus.  It is recommended to take an at home Covid 19 test that can be picked up an all pharmacy locations.   Many health care providers can now test patients at their office but not all are.  [facility] has multiple testing sites. For information on our COVID testing locations and hours go to [email or web address]  Please quarantine yourself while awaiting your test results.   COVID-19 is a respiratory illness with symptoms that are similar to the flu. Symptoms are typically mild to moderate, but there have been cases of severe illness and death due to the virus. The following symptoms may appear 2-14 days after exposure: Fever Cough Shortness of breath or difficulty breathing Chills Repeated shaking with chills Muscle pain Headache Sore throat New loss of taste or smell Fatigue Congestion or runny nose Nausea or vomiting Diarrhea  It is vitally important that if you feel that you have an infection such as this virus or any other virus that you stay home and away from places where you may spread it to others.  You should self-quarantine for 14 days if you have symptoms that could potentially be coronavirus or have been in close contact a with a person diagnosed with COVID-19 within the last 2 weeks. You should avoid contact with people age 79 and older.   You should wear a mask or cloth face covering over your nose and mouth if you must be around other people or animals, including pets (even at home). Try to stay at least 6 feet away from other people. This will protect the people around you.  You can use medication such as I have prescribed an anti-inflammatory - Naprosyn  500 mg. Take twice daily as needed for fever or body aches for 2 weeks  You may also take acetaminophen  (Tylenol ) as needed for fever.   Reduce your risk of any infection by using the same  precautions used for avoiding the common cold or flu:  Wash your hands often with soap and warm water for at least 20 seconds.  If soap and water are not readily available, use an alcohol-based hand sanitizer with at least 60% alcohol.  If coughing or sneezing, cover your mouth and nose by coughing or sneezing into the elbow areas of your shirt or coat, into a tissue or into your sleeve (not your hands). Avoid shaking hands with others and consider head nods or verbal greetings only. Avoid touching your eyes, nose, or mouth with unwashed hands.  Avoid close contact with people who are sick. Avoid places or events with large numbers of people in one location, like concerts or sporting events. Carefully consider travel plans you have or are making. If you are planning any travel outside or inside the [place] , visit the CDC's Travelers' Health webpage for the latest health notices. If you have some symptoms but not all symptoms, continue to monitor at home and seek medical attention if your symptoms worsen. If you are having a medical emergency, call 911.  HOME CARE Only take medications as instructed by your medical team. Drink plenty of fluids and get plenty of rest. A steam or ultrasonic humidifier can help if you have congestion.   GET HELP RIGHT AWAY IF YOU HAVE EMERGENCY WARNING SIGNS** FOR COVID-19. If you or someone is showing any of these signs seek emergency medical care immediately. Call 911 or proceed to your closest emergency facility if: You develop  worsening high fever. Trouble breathing Bluish lips or face Persistent pain or pressure in the chest New confusion Inability to wake or stay awake You cough up blood. Your symptoms become more severe  **This list is not all possible symptoms. Contact your medical provider for any symptoms that are sever or concerning to you.   MAKE SURE YOU  Understand these instructions. Will watch your condition. Will get help right away if you  are not doing well or get worse.  Your e-visit answers were reviewed by a board certified advanced clinical practitioner to complete your personal care plan.  Depending on the condition, your plan could have included both over the counter or prescription medications.  If there is a problem please reply once you have received a response from your provider.  Your safety is important to us .  If you have drug allergies check your prescription carefully.    You can use MyChart to ask questions about today's visit, request a non-urgent call back, or ask for a work or school excuse for 24 hours related to this e-Visit. If it has been greater than 24 hours you will need to follow up with your provider, or enter a new e-Visit to address those concerns. You will get an e-mail in the next two days asking about your experience.  I hope that your e-visit has been valuable and will speed your recovery. Thank you for using e-visits.   I have spent 5 minutes in review of e-visit questionnaire, review and updating patient chart, medical decision making and response to patient.   Delon CHRISTELLA Dickinson, PA-C
